# Patient Record
Sex: Female | Born: 1945 | Race: White | Hispanic: No | Smoking: Never smoker
Health system: Southern US, Community
[De-identification: ages and names within clinical notes are randomized; demographics above are authoritative.]

## PROBLEM LIST (undated history)

## (undated) DIAGNOSIS — N3281 Overactive bladder: Secondary | ICD-10-CM

## (undated) DIAGNOSIS — E559 Vitamin D deficiency, unspecified: Secondary | ICD-10-CM

## (undated) DIAGNOSIS — I10 Essential (primary) hypertension: Secondary | ICD-10-CM

## (undated) DIAGNOSIS — E785 Hyperlipidemia, unspecified: Secondary | ICD-10-CM

## (undated) DIAGNOSIS — I2510 Atherosclerotic heart disease of native coronary artery without angina pectoris: Secondary | ICD-10-CM

## (undated) DIAGNOSIS — K219 Gastro-esophageal reflux disease without esophagitis: Secondary | ICD-10-CM

## (undated) HISTORY — DX: Vitamin D deficiency, unspecified: E55.9

## (undated) HISTORY — DX: Essential (primary) hypertension: I10

## (undated) HISTORY — DX: Hyperlipidemia, unspecified: E78.5

## (undated) HISTORY — DX: Atherosclerotic heart disease of native coronary artery without angina pectoris: I25.10

## (undated) HISTORY — DX: Gastro-esophageal reflux disease without esophagitis: K21.9

## (undated) HISTORY — DX: Overactive bladder: N32.81

## (undated) HISTORY — PX: CORONARY ARTERY BYPASS GRAFT: SHX141

---

## 2006-03-27 ENCOUNTER — Inpatient Hospital Stay (HOSPITAL_COMMUNITY): Admission: AD | Admit: 2006-03-27 | Discharge: 2006-04-01 | Payer: Self-pay | Admitting: Cardiology

## 2006-03-27 ENCOUNTER — Ambulatory Visit: Payer: Self-pay | Admitting: Cardiology

## 2006-05-20 ENCOUNTER — Encounter: Admission: RE | Admit: 2006-05-20 | Discharge: 2006-05-20 | Payer: Self-pay | Admitting: Surgery

## 2007-03-05 IMAGING — CR DG CHEST 1V PORT
1 series · 1 of 1 positions shown · non-contrast
Comparison: 03/28/06.

CLINICAL DATA: CABG.
 PORTABLE ONE VIEW CHEST:

[view not recorded]
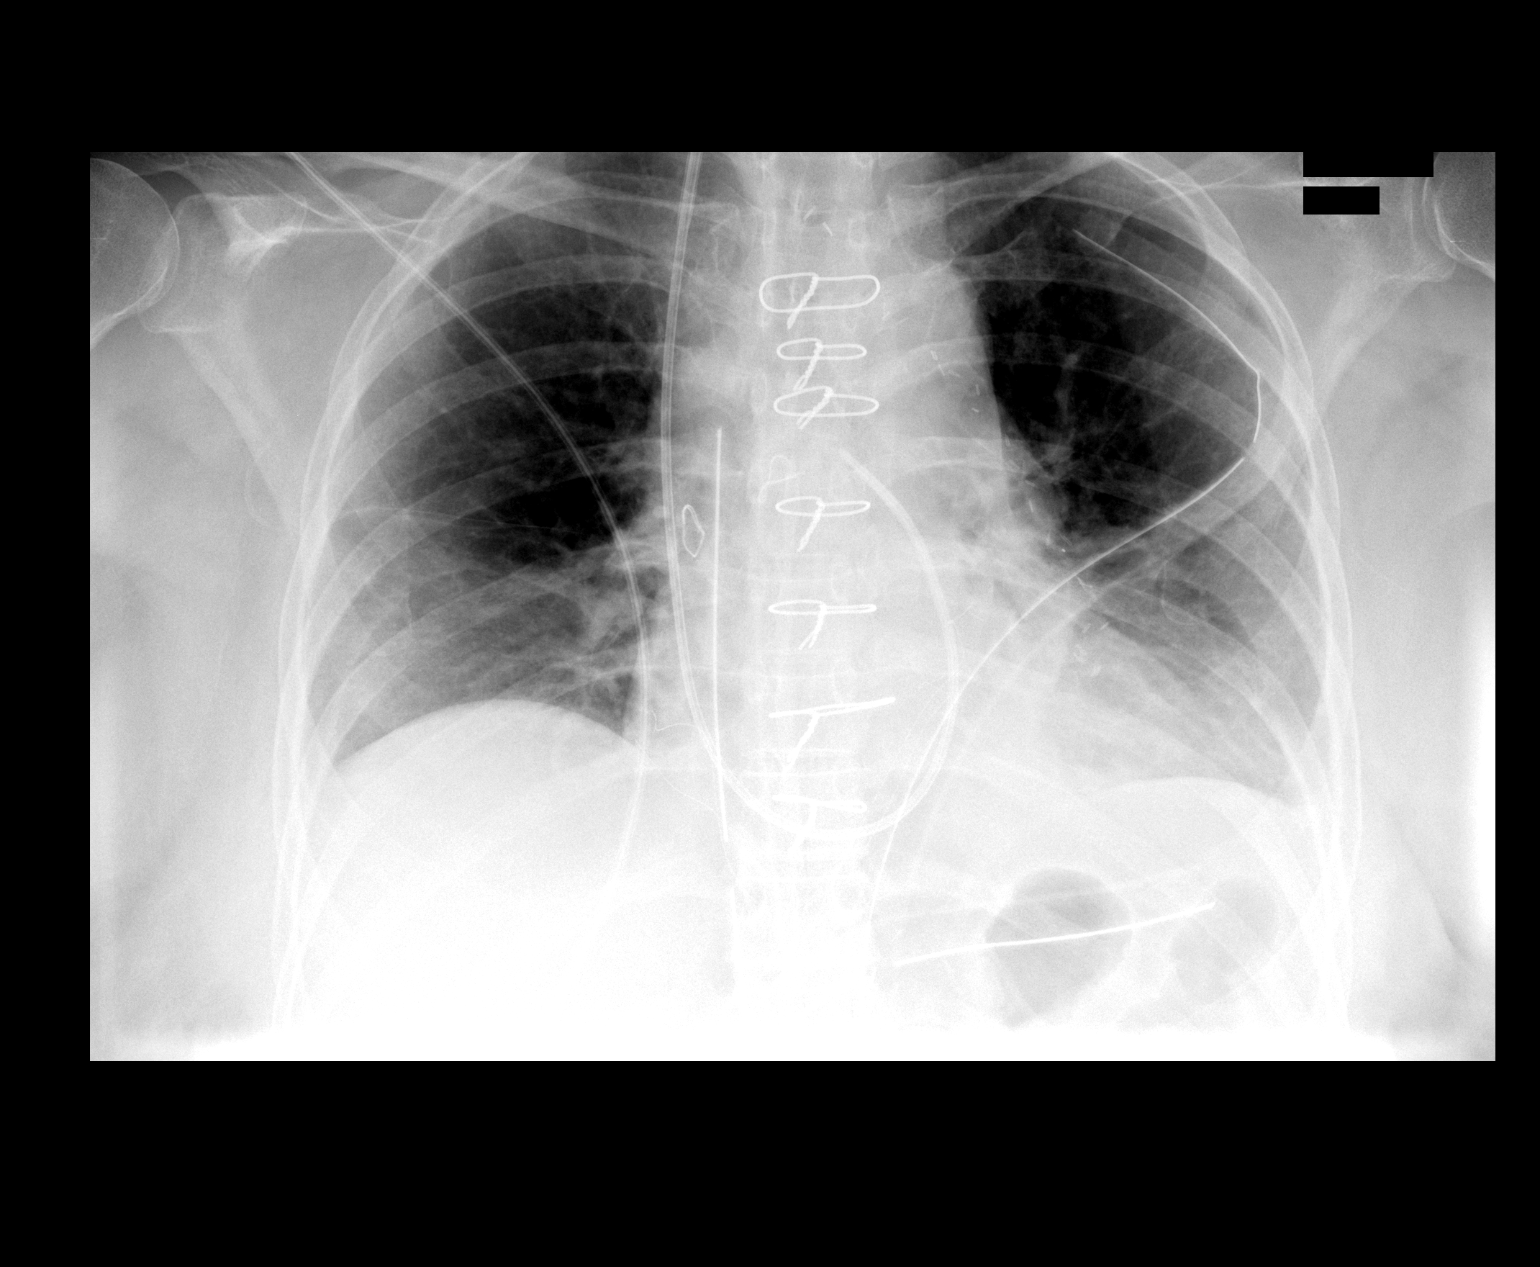

[1 of 1 positions shown; findings below may reference images not displayed]

FINDINGS: Post-op changes from CABG are noted.  There is a mediastinal drain and two left sided chest tubes.  No pneumothorax is identified.  There is atelectasis at both lung bases, left greater than right.
IMPRESSION: 1.  No change compared to prior exam.  
 2.  No pneumothorax. 
 3.  Bibasilar atelectasis persist.

 .

## 2007-03-07 IMAGING — CR DG CHEST 2V
2 series · 2 of 2 positions shown · non-contrast
Comparison: 03/30/06.

CLINICAL DATA: Status-post CABG. 
 CHEST - 2 VIEW ? 03/31/06:

[w chest pa]
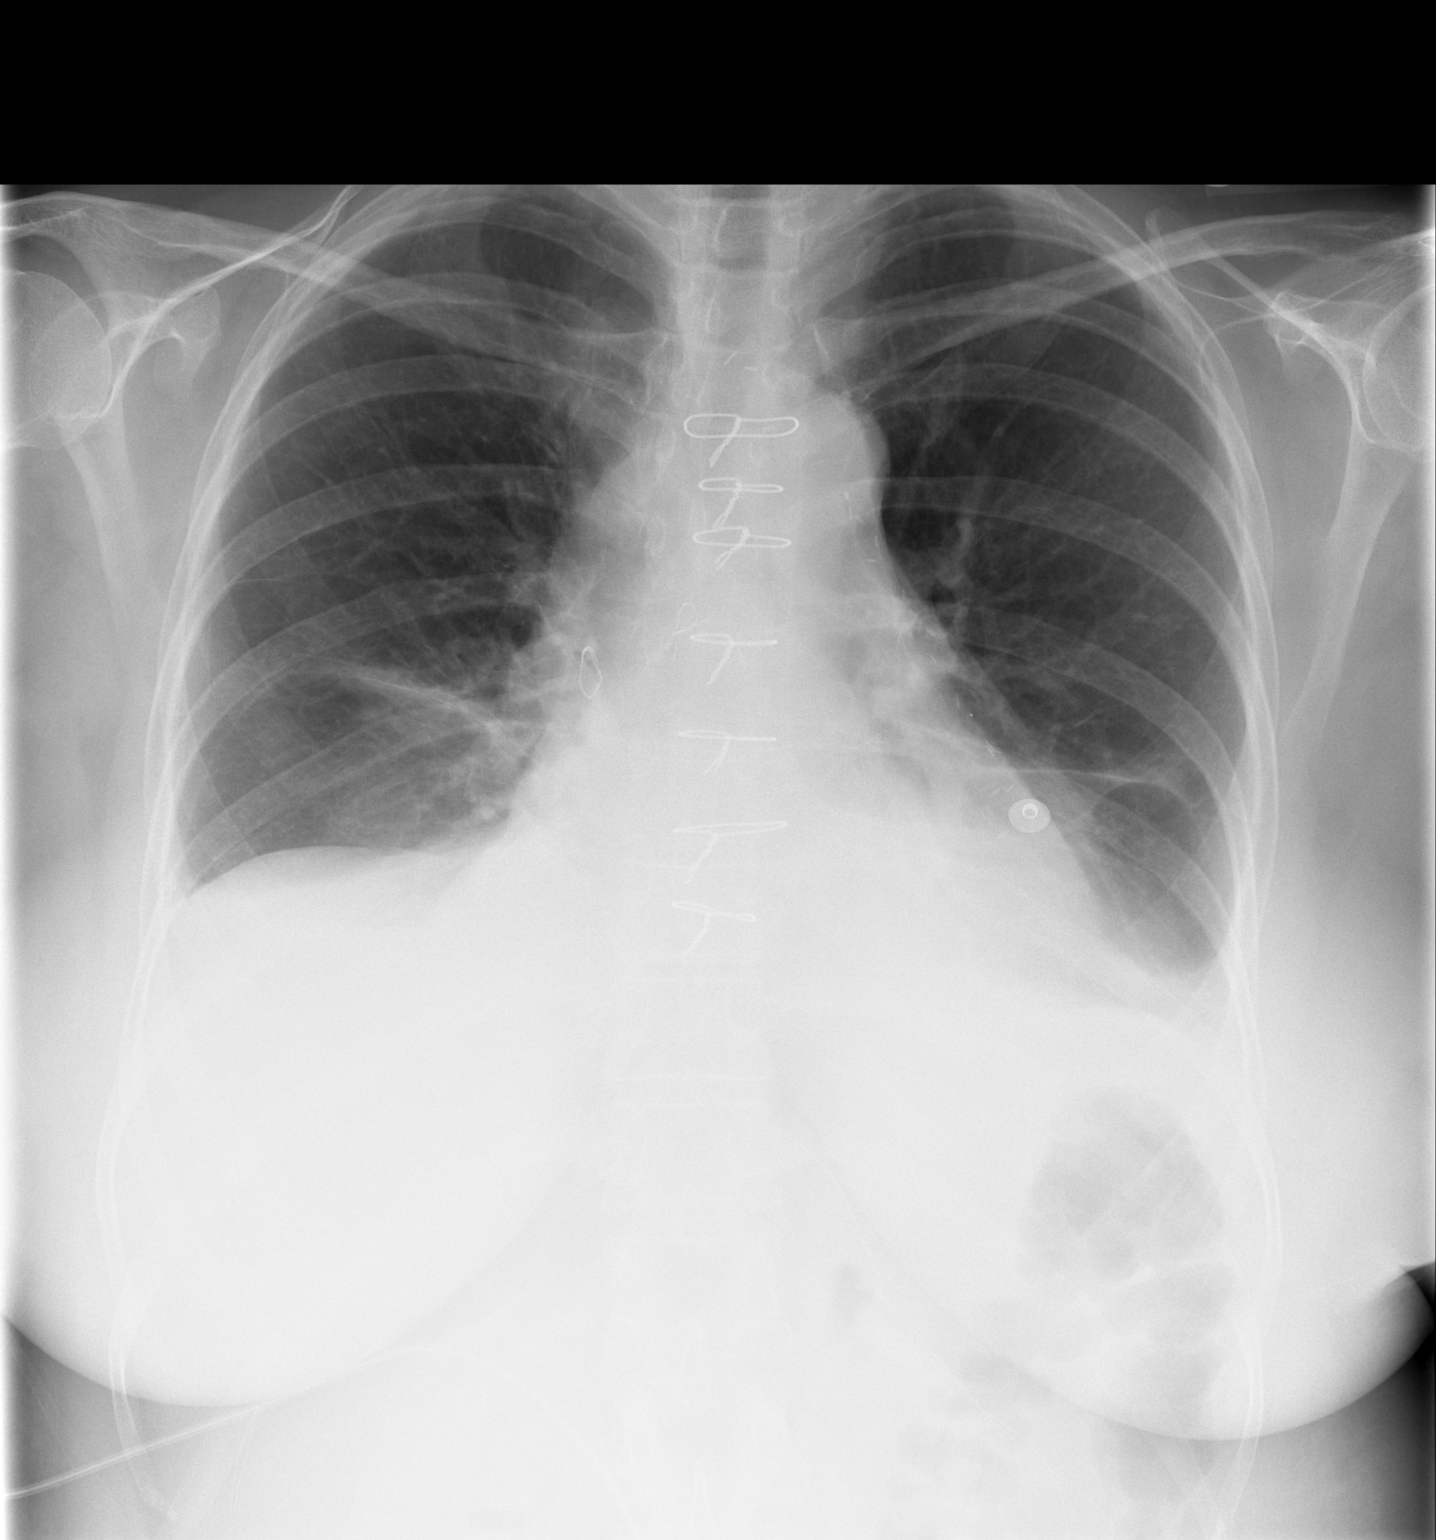

[w chest lat]
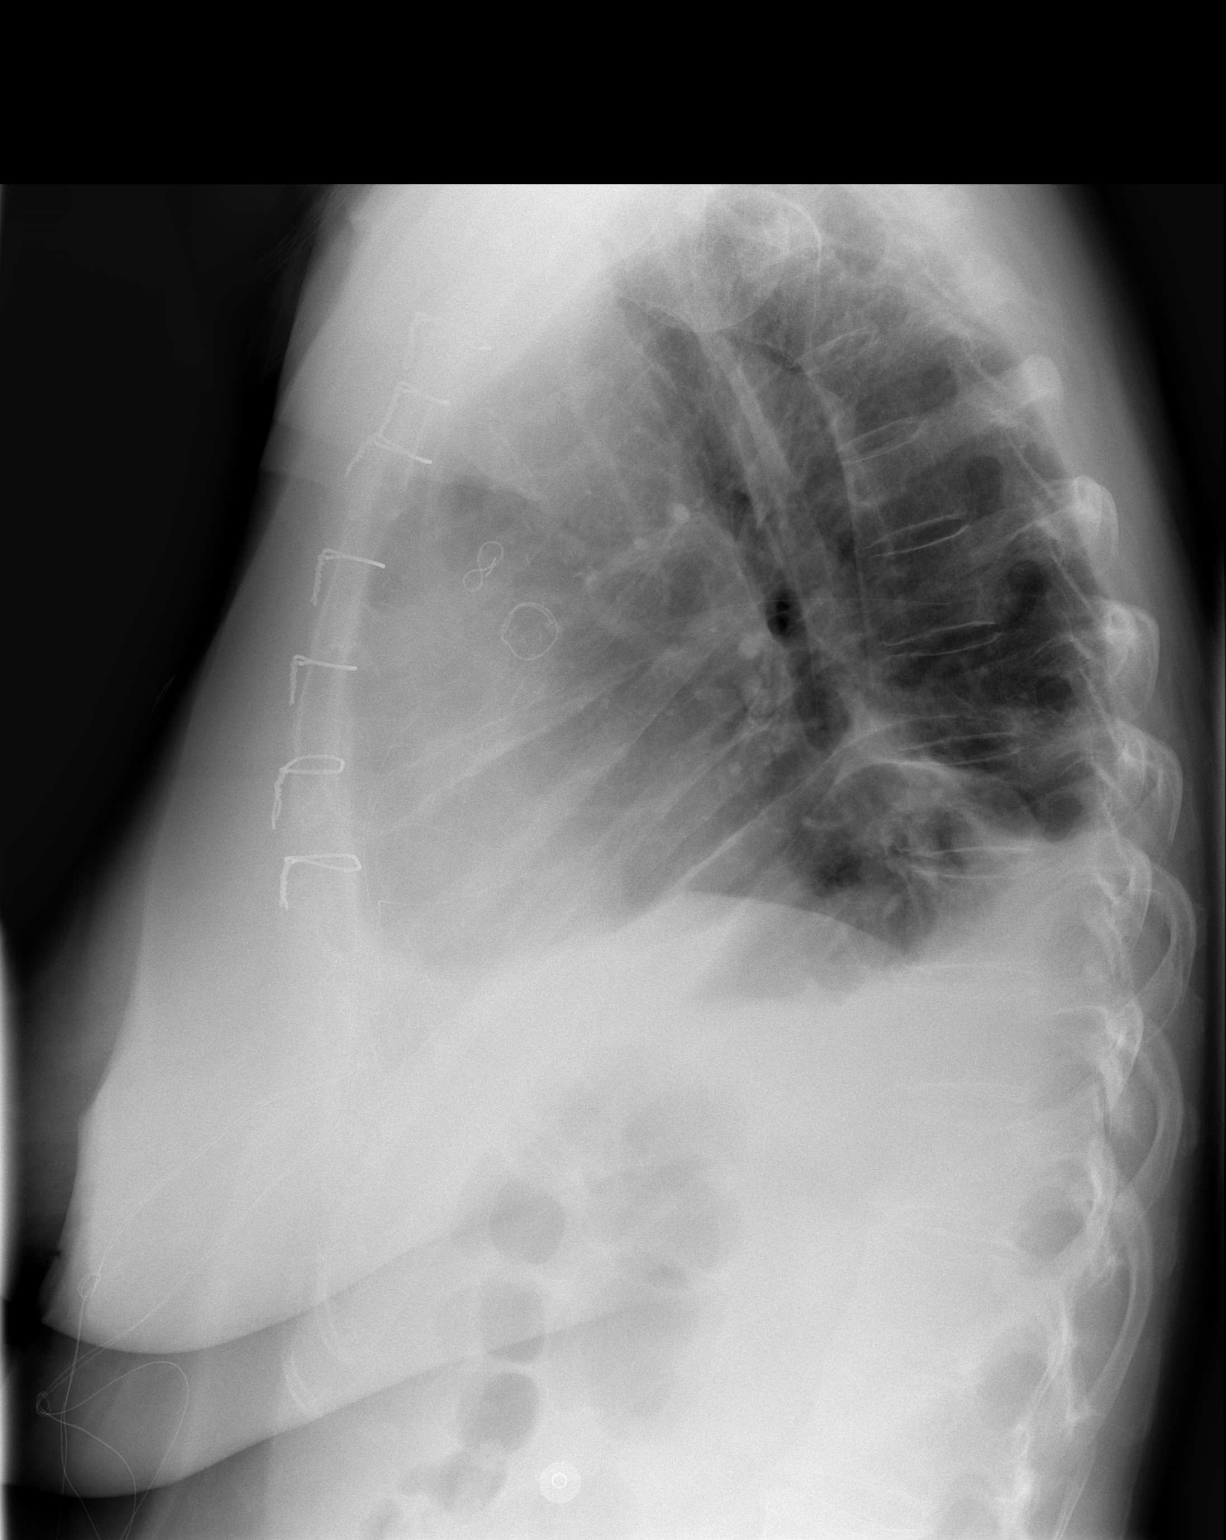

[2 of 2 positions shown; findings below may reference images not displayed]

There are small bilateral pleural effusions with residual bibasilar atelectasis, left greater than right. No pneumothorax is seen.  Stable heart size and mediastinal contours.
IMPRESSION: Small bilateral pleural effusions and associated mild bibasilar atelectasis.

## 2011-12-04 DIAGNOSIS — N318 Other neuromuscular dysfunction of bladder: Secondary | ICD-10-CM | POA: Diagnosis not present

## 2011-12-04 DIAGNOSIS — I10 Essential (primary) hypertension: Secondary | ICD-10-CM | POA: Diagnosis not present

## 2011-12-04 DIAGNOSIS — K219 Gastro-esophageal reflux disease without esophagitis: Secondary | ICD-10-CM | POA: Diagnosis not present

## 2011-12-04 DIAGNOSIS — E782 Mixed hyperlipidemia: Secondary | ICD-10-CM | POA: Diagnosis not present

## 2011-12-04 DIAGNOSIS — Z79899 Other long term (current) drug therapy: Secondary | ICD-10-CM | POA: Diagnosis not present

## 2011-12-19 DIAGNOSIS — I10 Essential (primary) hypertension: Secondary | ICD-10-CM | POA: Diagnosis not present

## 2011-12-19 DIAGNOSIS — I2510 Atherosclerotic heart disease of native coronary artery without angina pectoris: Secondary | ICD-10-CM | POA: Diagnosis not present

## 2011-12-19 DIAGNOSIS — Z79899 Other long term (current) drug therapy: Secondary | ICD-10-CM | POA: Diagnosis not present

## 2011-12-19 DIAGNOSIS — E785 Hyperlipidemia, unspecified: Secondary | ICD-10-CM | POA: Diagnosis not present

## 2012-01-02 DIAGNOSIS — I2510 Atherosclerotic heart disease of native coronary artery without angina pectoris: Secondary | ICD-10-CM | POA: Diagnosis not present

## 2012-01-02 DIAGNOSIS — E785 Hyperlipidemia, unspecified: Secondary | ICD-10-CM | POA: Diagnosis not present

## 2012-01-30 DIAGNOSIS — J029 Acute pharyngitis, unspecified: Secondary | ICD-10-CM | POA: Diagnosis not present

## 2012-03-04 DIAGNOSIS — I2510 Atherosclerotic heart disease of native coronary artery without angina pectoris: Secondary | ICD-10-CM | POA: Diagnosis not present

## 2012-03-04 DIAGNOSIS — Z79899 Other long term (current) drug therapy: Secondary | ICD-10-CM | POA: Diagnosis not present

## 2012-03-04 DIAGNOSIS — N318 Other neuromuscular dysfunction of bladder: Secondary | ICD-10-CM | POA: Diagnosis not present

## 2012-03-04 DIAGNOSIS — E782 Mixed hyperlipidemia: Secondary | ICD-10-CM | POA: Diagnosis not present

## 2012-03-04 DIAGNOSIS — I10 Essential (primary) hypertension: Secondary | ICD-10-CM | POA: Diagnosis not present

## 2012-06-03 DIAGNOSIS — I10 Essential (primary) hypertension: Secondary | ICD-10-CM | POA: Diagnosis not present

## 2012-06-03 DIAGNOSIS — E782 Mixed hyperlipidemia: Secondary | ICD-10-CM | POA: Diagnosis not present

## 2012-06-03 DIAGNOSIS — L03319 Cellulitis of trunk, unspecified: Secondary | ICD-10-CM | POA: Diagnosis not present

## 2012-06-03 DIAGNOSIS — D559 Anemia due to enzyme disorder, unspecified: Secondary | ICD-10-CM | POA: Diagnosis not present

## 2012-06-03 DIAGNOSIS — L02219 Cutaneous abscess of trunk, unspecified: Secondary | ICD-10-CM | POA: Diagnosis not present

## 2012-06-09 DIAGNOSIS — L03319 Cellulitis of trunk, unspecified: Secondary | ICD-10-CM | POA: Diagnosis not present

## 2012-06-09 DIAGNOSIS — Z6830 Body mass index (BMI) 30.0-30.9, adult: Secondary | ICD-10-CM | POA: Diagnosis not present

## 2012-06-09 DIAGNOSIS — L02219 Cutaneous abscess of trunk, unspecified: Secondary | ICD-10-CM | POA: Diagnosis not present

## 2012-06-15 DIAGNOSIS — L738 Other specified follicular disorders: Secondary | ICD-10-CM | POA: Diagnosis not present

## 2012-06-23 DIAGNOSIS — Z1212 Encounter for screening for malignant neoplasm of rectum: Secondary | ICD-10-CM | POA: Diagnosis not present

## 2012-06-23 DIAGNOSIS — Z0000 Encounter for general adult medical examination without abnormal findings: Secondary | ICD-10-CM | POA: Diagnosis not present

## 2012-06-23 DIAGNOSIS — Z79899 Other long term (current) drug therapy: Secondary | ICD-10-CM | POA: Diagnosis not present

## 2012-06-23 DIAGNOSIS — E559 Vitamin D deficiency, unspecified: Secondary | ICD-10-CM | POA: Diagnosis not present

## 2012-06-23 DIAGNOSIS — E782 Mixed hyperlipidemia: Secondary | ICD-10-CM | POA: Diagnosis not present

## 2012-06-23 DIAGNOSIS — I2510 Atherosclerotic heart disease of native coronary artery without angina pectoris: Secondary | ICD-10-CM | POA: Diagnosis not present

## 2012-06-23 DIAGNOSIS — I10 Essential (primary) hypertension: Secondary | ICD-10-CM | POA: Diagnosis not present

## 2012-07-17 DIAGNOSIS — Z1231 Encounter for screening mammogram for malignant neoplasm of breast: Secondary | ICD-10-CM | POA: Diagnosis not present

## 2012-07-27 DIAGNOSIS — N63 Unspecified lump in unspecified breast: Secondary | ICD-10-CM | POA: Diagnosis not present

## 2012-09-02 DIAGNOSIS — Z79899 Other long term (current) drug therapy: Secondary | ICD-10-CM | POA: Diagnosis not present

## 2012-09-02 DIAGNOSIS — I10 Essential (primary) hypertension: Secondary | ICD-10-CM | POA: Diagnosis not present

## 2012-09-02 DIAGNOSIS — I2510 Atherosclerotic heart disease of native coronary artery without angina pectoris: Secondary | ICD-10-CM | POA: Diagnosis not present

## 2012-09-02 DIAGNOSIS — E785 Hyperlipidemia, unspecified: Secondary | ICD-10-CM | POA: Diagnosis not present

## 2012-09-05 DIAGNOSIS — T148XXA Other injury of unspecified body region, initial encounter: Secondary | ICD-10-CM | POA: Diagnosis not present

## 2012-09-29 DIAGNOSIS — I10 Essential (primary) hypertension: Secondary | ICD-10-CM | POA: Diagnosis not present

## 2012-09-29 DIAGNOSIS — M1710 Unilateral primary osteoarthritis, unspecified knee: Secondary | ICD-10-CM | POA: Diagnosis not present

## 2012-09-29 DIAGNOSIS — Z6830 Body mass index (BMI) 30.0-30.9, adult: Secondary | ICD-10-CM | POA: Diagnosis not present

## 2012-09-29 DIAGNOSIS — E782 Mixed hyperlipidemia: Secondary | ICD-10-CM | POA: Diagnosis not present

## 2012-09-29 DIAGNOSIS — IMO0002 Reserved for concepts with insufficient information to code with codable children: Secondary | ICD-10-CM | POA: Diagnosis not present

## 2012-12-30 DIAGNOSIS — Z79899 Other long term (current) drug therapy: Secondary | ICD-10-CM | POA: Diagnosis not present

## 2012-12-30 DIAGNOSIS — E782 Mixed hyperlipidemia: Secondary | ICD-10-CM | POA: Diagnosis not present

## 2012-12-30 DIAGNOSIS — I10 Essential (primary) hypertension: Secondary | ICD-10-CM | POA: Diagnosis not present

## 2012-12-30 DIAGNOSIS — Z6829 Body mass index (BMI) 29.0-29.9, adult: Secondary | ICD-10-CM | POA: Diagnosis not present

## 2012-12-30 DIAGNOSIS — E559 Vitamin D deficiency, unspecified: Secondary | ICD-10-CM | POA: Diagnosis not present

## 2013-01-25 DIAGNOSIS — R928 Other abnormal and inconclusive findings on diagnostic imaging of breast: Secondary | ICD-10-CM | POA: Diagnosis not present

## 2013-02-24 DIAGNOSIS — I2510 Atherosclerotic heart disease of native coronary artery without angina pectoris: Secondary | ICD-10-CM | POA: Diagnosis not present

## 2013-02-24 DIAGNOSIS — E785 Hyperlipidemia, unspecified: Secondary | ICD-10-CM | POA: Diagnosis not present

## 2013-02-24 DIAGNOSIS — I10 Essential (primary) hypertension: Secondary | ICD-10-CM | POA: Diagnosis not present

## 2013-03-29 DIAGNOSIS — Z6830 Body mass index (BMI) 30.0-30.9, adult: Secondary | ICD-10-CM | POA: Diagnosis not present

## 2013-03-29 DIAGNOSIS — K219 Gastro-esophageal reflux disease without esophagitis: Secondary | ICD-10-CM | POA: Diagnosis not present

## 2013-03-29 DIAGNOSIS — I10 Essential (primary) hypertension: Secondary | ICD-10-CM | POA: Diagnosis not present

## 2013-03-29 DIAGNOSIS — R609 Edema, unspecified: Secondary | ICD-10-CM | POA: Diagnosis not present

## 2013-07-01 DIAGNOSIS — I10 Essential (primary) hypertension: Secondary | ICD-10-CM | POA: Diagnosis not present

## 2013-07-01 DIAGNOSIS — E559 Vitamin D deficiency, unspecified: Secondary | ICD-10-CM | POA: Diagnosis not present

## 2013-07-01 DIAGNOSIS — J309 Allergic rhinitis, unspecified: Secondary | ICD-10-CM | POA: Diagnosis not present

## 2013-07-01 DIAGNOSIS — E782 Mixed hyperlipidemia: Secondary | ICD-10-CM | POA: Diagnosis not present

## 2013-07-01 DIAGNOSIS — Z79899 Other long term (current) drug therapy: Secondary | ICD-10-CM | POA: Diagnosis not present

## 2013-08-02 DIAGNOSIS — R928 Other abnormal and inconclusive findings on diagnostic imaging of breast: Secondary | ICD-10-CM | POA: Diagnosis not present

## 2013-09-08 DIAGNOSIS — I259 Chronic ischemic heart disease, unspecified: Secondary | ICD-10-CM | POA: Diagnosis not present

## 2013-09-08 DIAGNOSIS — I10 Essential (primary) hypertension: Secondary | ICD-10-CM | POA: Diagnosis not present

## 2013-09-08 DIAGNOSIS — E785 Hyperlipidemia, unspecified: Secondary | ICD-10-CM | POA: Diagnosis not present

## 2013-10-01 DIAGNOSIS — E785 Hyperlipidemia, unspecified: Secondary | ICD-10-CM | POA: Diagnosis not present

## 2013-10-01 DIAGNOSIS — E559 Vitamin D deficiency, unspecified: Secondary | ICD-10-CM | POA: Diagnosis not present

## 2013-10-01 DIAGNOSIS — Z0000 Encounter for general adult medical examination without abnormal findings: Secondary | ICD-10-CM | POA: Diagnosis not present

## 2013-10-01 DIAGNOSIS — Z1212 Encounter for screening for malignant neoplasm of rectum: Secondary | ICD-10-CM | POA: Diagnosis not present

## 2013-10-01 DIAGNOSIS — I2510 Atherosclerotic heart disease of native coronary artery without angina pectoris: Secondary | ICD-10-CM | POA: Diagnosis not present

## 2013-11-08 DIAGNOSIS — Z13820 Encounter for screening for osteoporosis: Secondary | ICD-10-CM | POA: Diagnosis not present

## 2014-01-05 DIAGNOSIS — J309 Allergic rhinitis, unspecified: Secondary | ICD-10-CM | POA: Diagnosis not present

## 2014-01-05 DIAGNOSIS — Z79899 Other long term (current) drug therapy: Secondary | ICD-10-CM | POA: Diagnosis not present

## 2014-01-05 DIAGNOSIS — I10 Essential (primary) hypertension: Secondary | ICD-10-CM | POA: Diagnosis not present

## 2014-02-01 DIAGNOSIS — J111 Influenza due to unidentified influenza virus with other respiratory manifestations: Secondary | ICD-10-CM | POA: Diagnosis not present

## 2014-02-08 DIAGNOSIS — Z79899 Other long term (current) drug therapy: Secondary | ICD-10-CM | POA: Diagnosis not present

## 2014-02-08 DIAGNOSIS — Z6828 Body mass index (BMI) 28.0-28.9, adult: Secondary | ICD-10-CM | POA: Diagnosis not present

## 2014-02-08 DIAGNOSIS — R197 Diarrhea, unspecified: Secondary | ICD-10-CM | POA: Diagnosis not present

## 2014-02-08 DIAGNOSIS — K625 Hemorrhage of anus and rectum: Secondary | ICD-10-CM | POA: Diagnosis not present

## 2014-03-10 DIAGNOSIS — E785 Hyperlipidemia, unspecified: Secondary | ICD-10-CM | POA: Diagnosis not present

## 2014-03-10 DIAGNOSIS — I2510 Atherosclerotic heart disease of native coronary artery without angina pectoris: Secondary | ICD-10-CM | POA: Diagnosis not present

## 2014-03-10 DIAGNOSIS — I10 Essential (primary) hypertension: Secondary | ICD-10-CM | POA: Diagnosis not present

## 2014-03-10 DIAGNOSIS — Z79899 Other long term (current) drug therapy: Secondary | ICD-10-CM | POA: Diagnosis not present

## 2014-04-05 DIAGNOSIS — J45909 Unspecified asthma, uncomplicated: Secondary | ICD-10-CM | POA: Diagnosis not present

## 2014-04-05 DIAGNOSIS — E782 Mixed hyperlipidemia: Secondary | ICD-10-CM | POA: Diagnosis not present

## 2014-04-05 DIAGNOSIS — J309 Allergic rhinitis, unspecified: Secondary | ICD-10-CM | POA: Diagnosis not present

## 2014-04-05 DIAGNOSIS — H6990 Unspecified Eustachian tube disorder, unspecified ear: Secondary | ICD-10-CM | POA: Diagnosis not present

## 2014-04-26 DIAGNOSIS — H269 Unspecified cataract: Secondary | ICD-10-CM | POA: Diagnosis not present

## 2014-05-04 DIAGNOSIS — H43819 Vitreous degeneration, unspecified eye: Secondary | ICD-10-CM | POA: Diagnosis not present

## 2014-06-06 DIAGNOSIS — H43819 Vitreous degeneration, unspecified eye: Secondary | ICD-10-CM | POA: Diagnosis not present

## 2014-06-06 DIAGNOSIS — H269 Unspecified cataract: Secondary | ICD-10-CM | POA: Diagnosis not present

## 2014-07-04 DIAGNOSIS — H43819 Vitreous degeneration, unspecified eye: Secondary | ICD-10-CM | POA: Diagnosis not present

## 2014-07-04 DIAGNOSIS — H35439 Paving stone degeneration of retina, unspecified eye: Secondary | ICD-10-CM | POA: Diagnosis not present

## 2014-07-14 DIAGNOSIS — F39 Unspecified mood [affective] disorder: Secondary | ICD-10-CM | POA: Diagnosis not present

## 2014-07-14 DIAGNOSIS — E782 Mixed hyperlipidemia: Secondary | ICD-10-CM | POA: Diagnosis not present

## 2014-07-14 DIAGNOSIS — H68009 Unspecified Eustachian salpingitis, unspecified ear: Secondary | ICD-10-CM | POA: Diagnosis not present

## 2014-07-14 DIAGNOSIS — Z79899 Other long term (current) drug therapy: Secondary | ICD-10-CM | POA: Diagnosis not present

## 2014-07-14 DIAGNOSIS — J309 Allergic rhinitis, unspecified: Secondary | ICD-10-CM | POA: Diagnosis not present

## 2014-07-14 DIAGNOSIS — E559 Vitamin D deficiency, unspecified: Secondary | ICD-10-CM | POA: Diagnosis not present

## 2014-08-25 DIAGNOSIS — Z23 Encounter for immunization: Secondary | ICD-10-CM | POA: Diagnosis not present

## 2014-08-25 DIAGNOSIS — J45909 Unspecified asthma, uncomplicated: Secondary | ICD-10-CM | POA: Diagnosis not present

## 2014-08-25 DIAGNOSIS — E782 Mixed hyperlipidemia: Secondary | ICD-10-CM | POA: Diagnosis not present

## 2014-08-25 DIAGNOSIS — Z79899 Other long term (current) drug therapy: Secondary | ICD-10-CM | POA: Diagnosis not present

## 2014-08-25 DIAGNOSIS — Z0000 Encounter for general adult medical examination without abnormal findings: Secondary | ICD-10-CM | POA: Diagnosis not present

## 2014-08-25 DIAGNOSIS — E559 Vitamin D deficiency, unspecified: Secondary | ICD-10-CM | POA: Diagnosis not present

## 2014-08-25 DIAGNOSIS — Z1231 Encounter for screening mammogram for malignant neoplasm of breast: Secondary | ICD-10-CM | POA: Diagnosis not present

## 2014-08-25 DIAGNOSIS — I10 Essential (primary) hypertension: Secondary | ICD-10-CM | POA: Diagnosis not present

## 2014-09-21 DIAGNOSIS — N6001 Solitary cyst of right breast: Secondary | ICD-10-CM | POA: Diagnosis not present

## 2014-09-21 DIAGNOSIS — N6009 Solitary cyst of unspecified breast: Secondary | ICD-10-CM | POA: Diagnosis not present

## 2014-09-21 DIAGNOSIS — R928 Other abnormal and inconclusive findings on diagnostic imaging of breast: Secondary | ICD-10-CM | POA: Diagnosis not present

## 2014-11-24 DIAGNOSIS — I10 Essential (primary) hypertension: Secondary | ICD-10-CM | POA: Diagnosis not present

## 2014-11-24 DIAGNOSIS — K219 Gastro-esophageal reflux disease without esophagitis: Secondary | ICD-10-CM | POA: Diagnosis not present

## 2014-11-24 DIAGNOSIS — J309 Allergic rhinitis, unspecified: Secondary | ICD-10-CM | POA: Diagnosis not present

## 2014-11-24 DIAGNOSIS — E559 Vitamin D deficiency, unspecified: Secondary | ICD-10-CM | POA: Diagnosis not present

## 2014-11-24 DIAGNOSIS — R0689 Other abnormalities of breathing: Secondary | ICD-10-CM | POA: Diagnosis not present

## 2014-11-24 DIAGNOSIS — N3281 Overactive bladder: Secondary | ICD-10-CM | POA: Diagnosis not present

## 2014-11-24 DIAGNOSIS — Z23 Encounter for immunization: Secondary | ICD-10-CM | POA: Diagnosis not present

## 2014-11-24 DIAGNOSIS — Z6830 Body mass index (BMI) 30.0-30.9, adult: Secondary | ICD-10-CM | POA: Diagnosis not present

## 2014-11-24 DIAGNOSIS — E782 Mixed hyperlipidemia: Secondary | ICD-10-CM | POA: Diagnosis not present

## 2014-12-07 DIAGNOSIS — K219 Gastro-esophageal reflux disease without esophagitis: Secondary | ICD-10-CM | POA: Diagnosis not present

## 2014-12-07 DIAGNOSIS — R1310 Dysphagia, unspecified: Secondary | ICD-10-CM | POA: Diagnosis not present

## 2014-12-14 DIAGNOSIS — R1310 Dysphagia, unspecified: Secondary | ICD-10-CM | POA: Diagnosis not present

## 2014-12-16 DIAGNOSIS — R1310 Dysphagia, unspecified: Secondary | ICD-10-CM | POA: Diagnosis not present

## 2014-12-16 DIAGNOSIS — K222 Esophageal obstruction: Secondary | ICD-10-CM | POA: Diagnosis not present

## 2014-12-16 DIAGNOSIS — K449 Diaphragmatic hernia without obstruction or gangrene: Secondary | ICD-10-CM | POA: Diagnosis not present

## 2014-12-16 DIAGNOSIS — I519 Heart disease, unspecified: Secondary | ICD-10-CM | POA: Diagnosis not present

## 2014-12-16 DIAGNOSIS — K3189 Other diseases of stomach and duodenum: Secondary | ICD-10-CM | POA: Diagnosis not present

## 2014-12-16 DIAGNOSIS — I10 Essential (primary) hypertension: Secondary | ICD-10-CM | POA: Diagnosis not present

## 2014-12-16 DIAGNOSIS — K317 Polyp of stomach and duodenum: Secondary | ICD-10-CM | POA: Diagnosis not present

## 2015-01-18 DIAGNOSIS — R1310 Dysphagia, unspecified: Secondary | ICD-10-CM | POA: Diagnosis not present

## 2015-01-18 DIAGNOSIS — K219 Gastro-esophageal reflux disease without esophagitis: Secondary | ICD-10-CM | POA: Diagnosis not present

## 2015-03-01 DIAGNOSIS — N3281 Overactive bladder: Secondary | ICD-10-CM | POA: Diagnosis not present

## 2015-03-01 DIAGNOSIS — L0233 Carbuncle of buttock: Secondary | ICD-10-CM | POA: Diagnosis not present

## 2015-03-01 DIAGNOSIS — Z6830 Body mass index (BMI) 30.0-30.9, adult: Secondary | ICD-10-CM | POA: Diagnosis not present

## 2015-03-01 DIAGNOSIS — J45909 Unspecified asthma, uncomplicated: Secondary | ICD-10-CM | POA: Diagnosis not present

## 2015-03-01 DIAGNOSIS — J309 Allergic rhinitis, unspecified: Secondary | ICD-10-CM | POA: Diagnosis not present

## 2015-03-06 DIAGNOSIS — E782 Mixed hyperlipidemia: Secondary | ICD-10-CM | POA: Diagnosis not present

## 2015-03-06 DIAGNOSIS — I10 Essential (primary) hypertension: Secondary | ICD-10-CM | POA: Diagnosis not present

## 2015-03-06 DIAGNOSIS — Z79899 Other long term (current) drug therapy: Secondary | ICD-10-CM | POA: Diagnosis not present

## 2015-03-06 DIAGNOSIS — K219 Gastro-esophageal reflux disease without esophagitis: Secondary | ICD-10-CM | POA: Diagnosis not present

## 2015-03-06 DIAGNOSIS — J309 Allergic rhinitis, unspecified: Secondary | ICD-10-CM | POA: Diagnosis not present

## 2015-03-06 DIAGNOSIS — E559 Vitamin D deficiency, unspecified: Secondary | ICD-10-CM | POA: Diagnosis not present

## 2015-03-06 DIAGNOSIS — Z6830 Body mass index (BMI) 30.0-30.9, adult: Secondary | ICD-10-CM | POA: Diagnosis not present

## 2015-08-28 DIAGNOSIS — Z23 Encounter for immunization: Secondary | ICD-10-CM | POA: Diagnosis not present

## 2015-09-11 DIAGNOSIS — K219 Gastro-esophageal reflux disease without esophagitis: Secondary | ICD-10-CM | POA: Diagnosis not present

## 2015-09-11 DIAGNOSIS — E782 Mixed hyperlipidemia: Secondary | ICD-10-CM | POA: Diagnosis not present

## 2015-09-11 DIAGNOSIS — M8589 Other specified disorders of bone density and structure, multiple sites: Secondary | ICD-10-CM | POA: Diagnosis not present

## 2015-09-11 DIAGNOSIS — E559 Vitamin D deficiency, unspecified: Secondary | ICD-10-CM | POA: Diagnosis not present

## 2015-09-11 DIAGNOSIS — Z9181 History of falling: Secondary | ICD-10-CM | POA: Diagnosis not present

## 2015-09-11 DIAGNOSIS — Z0000 Encounter for general adult medical examination without abnormal findings: Secondary | ICD-10-CM | POA: Diagnosis not present

## 2015-09-11 DIAGNOSIS — Z6830 Body mass index (BMI) 30.0-30.9, adult: Secondary | ICD-10-CM | POA: Diagnosis not present

## 2015-09-11 DIAGNOSIS — Z1231 Encounter for screening mammogram for malignant neoplasm of breast: Secondary | ICD-10-CM | POA: Diagnosis not present

## 2015-09-11 DIAGNOSIS — Z23 Encounter for immunization: Secondary | ICD-10-CM | POA: Diagnosis not present

## 2015-09-11 DIAGNOSIS — Z79899 Other long term (current) drug therapy: Secondary | ICD-10-CM | POA: Diagnosis not present

## 2015-09-11 DIAGNOSIS — Z1212 Encounter for screening for malignant neoplasm of rectum: Secondary | ICD-10-CM | POA: Diagnosis not present

## 2015-09-11 DIAGNOSIS — I10 Essential (primary) hypertension: Secondary | ICD-10-CM | POA: Diagnosis not present

## 2015-09-14 DIAGNOSIS — E782 Mixed hyperlipidemia: Secondary | ICD-10-CM | POA: Diagnosis not present

## 2015-09-14 DIAGNOSIS — I10 Essential (primary) hypertension: Secondary | ICD-10-CM | POA: Diagnosis not present

## 2015-09-14 DIAGNOSIS — I2510 Atherosclerotic heart disease of native coronary artery without angina pectoris: Secondary | ICD-10-CM | POA: Diagnosis not present

## 2015-09-14 DIAGNOSIS — Z6830 Body mass index (BMI) 30.0-30.9, adult: Secondary | ICD-10-CM | POA: Diagnosis not present

## 2015-11-10 DIAGNOSIS — Z13820 Encounter for screening for osteoporosis: Secondary | ICD-10-CM | POA: Diagnosis not present

## 2015-11-10 DIAGNOSIS — M8589 Other specified disorders of bone density and structure, multiple sites: Secondary | ICD-10-CM | POA: Diagnosis not present

## 2015-11-10 DIAGNOSIS — Z1231 Encounter for screening mammogram for malignant neoplasm of breast: Secondary | ICD-10-CM | POA: Diagnosis not present

## 2015-11-22 DIAGNOSIS — J019 Acute sinusitis, unspecified: Secondary | ICD-10-CM | POA: Diagnosis not present

## 2015-11-22 DIAGNOSIS — Z6830 Body mass index (BMI) 30.0-30.9, adult: Secondary | ICD-10-CM | POA: Diagnosis not present

## 2015-11-22 DIAGNOSIS — J209 Acute bronchitis, unspecified: Secondary | ICD-10-CM | POA: Diagnosis not present

## 2016-01-09 DIAGNOSIS — J309 Allergic rhinitis, unspecified: Secondary | ICD-10-CM | POA: Diagnosis not present

## 2016-01-09 DIAGNOSIS — Z6829 Body mass index (BMI) 29.0-29.9, adult: Secondary | ICD-10-CM | POA: Diagnosis not present

## 2016-01-09 DIAGNOSIS — E559 Vitamin D deficiency, unspecified: Secondary | ICD-10-CM | POA: Diagnosis not present

## 2016-01-09 DIAGNOSIS — N3281 Overactive bladder: Secondary | ICD-10-CM | POA: Diagnosis not present

## 2016-01-09 DIAGNOSIS — F39 Unspecified mood [affective] disorder: Secondary | ICD-10-CM | POA: Diagnosis not present

## 2016-01-09 DIAGNOSIS — E782 Mixed hyperlipidemia: Secondary | ICD-10-CM | POA: Diagnosis not present

## 2016-01-09 DIAGNOSIS — K219 Gastro-esophageal reflux disease without esophagitis: Secondary | ICD-10-CM | POA: Diagnosis not present

## 2016-01-09 DIAGNOSIS — E663 Overweight: Secondary | ICD-10-CM | POA: Diagnosis not present

## 2016-01-09 DIAGNOSIS — I2510 Atherosclerotic heart disease of native coronary artery without angina pectoris: Secondary | ICD-10-CM | POA: Diagnosis not present

## 2016-01-09 DIAGNOSIS — I10 Essential (primary) hypertension: Secondary | ICD-10-CM | POA: Diagnosis not present

## 2016-01-09 DIAGNOSIS — Z1389 Encounter for screening for other disorder: Secondary | ICD-10-CM | POA: Diagnosis not present

## 2016-05-23 DIAGNOSIS — B372 Candidiasis of skin and nail: Secondary | ICD-10-CM | POA: Diagnosis not present

## 2016-05-23 DIAGNOSIS — Z6830 Body mass index (BMI) 30.0-30.9, adult: Secondary | ICD-10-CM | POA: Diagnosis not present

## 2016-09-17 DIAGNOSIS — I10 Essential (primary) hypertension: Secondary | ICD-10-CM | POA: Diagnosis not present

## 2016-09-17 DIAGNOSIS — Z0000 Encounter for general adult medical examination without abnormal findings: Secondary | ICD-10-CM | POA: Diagnosis not present

## 2016-09-17 DIAGNOSIS — Z6829 Body mass index (BMI) 29.0-29.9, adult: Secondary | ICD-10-CM | POA: Diagnosis not present

## 2016-09-17 DIAGNOSIS — Z9181 History of falling: Secondary | ICD-10-CM | POA: Diagnosis not present

## 2016-09-17 DIAGNOSIS — E782 Mixed hyperlipidemia: Secondary | ICD-10-CM | POA: Diagnosis not present

## 2016-09-17 DIAGNOSIS — E559 Vitamin D deficiency, unspecified: Secondary | ICD-10-CM | POA: Diagnosis not present

## 2016-09-17 DIAGNOSIS — J309 Allergic rhinitis, unspecified: Secondary | ICD-10-CM | POA: Diagnosis not present

## 2016-09-17 DIAGNOSIS — Z23 Encounter for immunization: Secondary | ICD-10-CM | POA: Diagnosis not present

## 2016-09-17 DIAGNOSIS — N3281 Overactive bladder: Secondary | ICD-10-CM | POA: Diagnosis not present

## 2016-09-17 DIAGNOSIS — Z79899 Other long term (current) drug therapy: Secondary | ICD-10-CM | POA: Diagnosis not present

## 2016-09-17 DIAGNOSIS — Z1231 Encounter for screening mammogram for malignant neoplasm of breast: Secondary | ICD-10-CM | POA: Diagnosis not present

## 2016-11-13 DIAGNOSIS — Z1231 Encounter for screening mammogram for malignant neoplasm of breast: Secondary | ICD-10-CM | POA: Diagnosis not present

## 2016-12-05 DIAGNOSIS — R928 Other abnormal and inconclusive findings on diagnostic imaging of breast: Secondary | ICD-10-CM | POA: Diagnosis not present

## 2016-12-26 DIAGNOSIS — L820 Inflamed seborrheic keratosis: Secondary | ICD-10-CM | POA: Diagnosis not present

## 2016-12-26 DIAGNOSIS — L814 Other melanin hyperpigmentation: Secondary | ICD-10-CM | POA: Diagnosis not present

## 2016-12-26 DIAGNOSIS — C44629 Squamous cell carcinoma of skin of left upper limb, including shoulder: Secondary | ICD-10-CM | POA: Diagnosis not present

## 2017-01-15 DIAGNOSIS — N3281 Overactive bladder: Secondary | ICD-10-CM | POA: Diagnosis not present

## 2017-01-15 DIAGNOSIS — Z6829 Body mass index (BMI) 29.0-29.9, adult: Secondary | ICD-10-CM | POA: Diagnosis not present

## 2017-01-15 DIAGNOSIS — E559 Vitamin D deficiency, unspecified: Secondary | ICD-10-CM | POA: Diagnosis not present

## 2017-01-15 DIAGNOSIS — E782 Mixed hyperlipidemia: Secondary | ICD-10-CM | POA: Diagnosis not present

## 2017-01-15 DIAGNOSIS — J309 Allergic rhinitis, unspecified: Secondary | ICD-10-CM | POA: Diagnosis not present

## 2017-01-15 DIAGNOSIS — E663 Overweight: Secondary | ICD-10-CM | POA: Diagnosis not present

## 2017-01-15 DIAGNOSIS — K219 Gastro-esophageal reflux disease without esophagitis: Secondary | ICD-10-CM | POA: Diagnosis not present

## 2017-01-15 DIAGNOSIS — Z79899 Other long term (current) drug therapy: Secondary | ICD-10-CM | POA: Diagnosis not present

## 2017-01-15 DIAGNOSIS — I10 Essential (primary) hypertension: Secondary | ICD-10-CM | POA: Diagnosis not present

## 2017-07-03 DIAGNOSIS — J309 Allergic rhinitis, unspecified: Secondary | ICD-10-CM | POA: Diagnosis not present

## 2017-07-03 DIAGNOSIS — F419 Anxiety disorder, unspecified: Secondary | ICD-10-CM | POA: Diagnosis not present

## 2017-07-03 DIAGNOSIS — Z79899 Other long term (current) drug therapy: Secondary | ICD-10-CM | POA: Diagnosis not present

## 2017-07-03 DIAGNOSIS — Z6829 Body mass index (BMI) 29.0-29.9, adult: Secondary | ICD-10-CM | POA: Diagnosis not present

## 2017-07-03 DIAGNOSIS — Z9181 History of falling: Secondary | ICD-10-CM | POA: Diagnosis not present

## 2017-07-03 DIAGNOSIS — K219 Gastro-esophageal reflux disease without esophagitis: Secondary | ICD-10-CM | POA: Diagnosis not present

## 2017-07-03 DIAGNOSIS — I10 Essential (primary) hypertension: Secondary | ICD-10-CM | POA: Diagnosis not present

## 2017-07-03 DIAGNOSIS — E559 Vitamin D deficiency, unspecified: Secondary | ICD-10-CM | POA: Diagnosis not present

## 2017-07-03 DIAGNOSIS — N3281 Overactive bladder: Secondary | ICD-10-CM | POA: Diagnosis not present

## 2017-07-03 DIAGNOSIS — I2510 Atherosclerotic heart disease of native coronary artery without angina pectoris: Secondary | ICD-10-CM | POA: Diagnosis not present

## 2017-07-03 DIAGNOSIS — Z1389 Encounter for screening for other disorder: Secondary | ICD-10-CM | POA: Diagnosis not present

## 2017-07-03 DIAGNOSIS — E782 Mixed hyperlipidemia: Secondary | ICD-10-CM | POA: Diagnosis not present

## 2017-07-10 ENCOUNTER — Encounter: Payer: Self-pay | Admitting: Cardiology

## 2017-08-08 DIAGNOSIS — Z23 Encounter for immunization: Secondary | ICD-10-CM | POA: Diagnosis not present

## 2017-08-26 ENCOUNTER — Ambulatory Visit: Payer: Self-pay | Admitting: Cardiology

## 2017-08-26 ENCOUNTER — Ambulatory Visit (INDEPENDENT_AMBULATORY_CARE_PROVIDER_SITE_OTHER): Payer: Medicare Other | Admitting: Cardiology

## 2017-08-26 ENCOUNTER — Encounter: Payer: Self-pay | Admitting: Cardiology

## 2017-08-26 ENCOUNTER — Other Ambulatory Visit: Payer: Self-pay

## 2017-08-26 VITALS — BP 124/70 | HR 63 | Ht 68.0 in | Wt 191.0 lb

## 2017-08-26 DIAGNOSIS — E782 Mixed hyperlipidemia: Secondary | ICD-10-CM | POA: Diagnosis not present

## 2017-08-26 DIAGNOSIS — I2510 Atherosclerotic heart disease of native coronary artery without angina pectoris: Secondary | ICD-10-CM

## 2017-08-26 DIAGNOSIS — I10 Essential (primary) hypertension: Secondary | ICD-10-CM

## 2017-08-26 DIAGNOSIS — E785 Hyperlipidemia, unspecified: Secondary | ICD-10-CM | POA: Insufficient documentation

## 2017-08-26 HISTORY — DX: Atherosclerotic heart disease of native coronary artery without angina pectoris: I25.10

## 2017-08-26 NOTE — Patient Instructions (Signed)
Medication Instructions:  Your physician recommends that you continue on your current medications as directed. Please refer to the Current Medication list given to you today.   Labwork: None  Testing/Procedures: None  Follow-Up: 6 months  Any Other Special Instructions Will Be Listed Below (If Applicable).     If you need a refill on your cardiac medications before your next appointment, please call your pharmacy.   

## 2017-08-26 NOTE — Progress Notes (Signed)
Cardiology Office Note:    Date:  08/26/2017   ID:  Paige Walters, DOB 03-31-1946, MRN [id number]  PCP:  Nicholos Johns, MD  Cardiologist:  Jenean Lindau, MD   Referring MD: Nicholos Johns, MD    ASSESSMENT:    1. Coronary artery disease involving native coronary artery of native heart without angina pectoris   2. Essential hypertension   3. Mixed hyperlipidemia    PLAN:    In order of problems listed above:  1. Secondary prevention stressed to the patient. Importance of compliance with diet and medications stressed and she verbalized understanding. 2. Her blood pressure stable and diet was discussed with dyslipidemia and essential hypertension and she verbalized understanding. Her lipids are followed by her primary care physician. 3. Importance of regular exercise stressed and this was outlined to her and she agreed to follow these regularly from now on. 4. Patient will be seen in follow-up appointment in 6 months or earlier if the patient has any concerns.    Medication Adjustments/Labs and Tests Ordered: Current medicines are reviewed at length with the patient today.  Concerns regarding medicines are outlined above.  No orders of the defined types were placed in this encounter.  No orders of the defined types were placed in this encounter.    History of Present Illness:    Paige Walters is a 71 y.o. female who is being seen today for the evaluation of coronary artery disease at the request of Nicholos Johns, MD. Patient is a pleasant 71 year old female. She has past medical history of coronary artery disease, essential hypertension and dyslipidemia. She has been taken care of by me and at the previous practice and wants her care to be transferred. She denies any chest pain orthopnea or PND. At the time of my evaluation she is alert awake oriented and in no distress. She leads a sedentary lifestyle and does not exercise on a regular basis.  Past Medical History:  Diagnosis Date  .  CAD (coronary artery disease)   . GERD (gastroesophageal reflux disease)   . Hyperlipidemia   . Hypertension   . Overactive bladder   . Vitamin D deficiency     Past Surgical History:  Procedure Laterality Date  . CORONARY ARTERY BYPASS GRAFT      Current Medications: Current Meds  Medication Sig  . Coenzyme Q-10 200 MG CAPS Take 300 mg by mouth daily.  . DOCOSAHEXAENOIC ACID PO Take 1 capsule by mouth daily.  Marland Kitchen glucosamine-chondroitin 500-400 MG tablet Take 1 tablet by mouth daily.  Marland Kitchen lisinopril-hydrochlorothiazide (PRINZIDE,ZESTORETIC) 20-12.5 MG tablet Take 1 tablet by mouth daily.  . montelukast (SINGULAIR) 10 MG tablet Take 10 mg by mouth daily.  Marland Kitchen omeprazole (PRILOSEC) 40 MG capsule Take 40 mg by mouth daily.  . rosuvastatin (CRESTOR) 20 MG tablet Take 20 mg by mouth daily.  . sertraline (ZOLOFT) 50 MG tablet Take 50 mg by mouth daily.  . Vitamin D, Ergocalciferol, (DRISDOL) 50000 units CAPS capsule Take 5,000 Units by mouth daily.     Allergies:   Sulfa antibiotics   Social History   Social History  . Marital status: Married    Spouse name: N/A  . Number of children: N/A  . Years of education: N/A   Social History Main Topics  . Smoking status: Never Smoker  . Smokeless tobacco: Never Used  . Alcohol use No  . Drug use: No  . Sexual activity: Not Asked   Other Topics Concern  . None  Social History Narrative  . None     Family History: The patient's family history includes Arthritis in her mother; Diabetes in her brother; Heart disease in her brother and mother; Hypertension in her mother; Thyroid disease in her mother.  ROS:   Please see the history of present illness.    All other systems reviewed and are negative.  EKGs/Labs/Other Studies Reviewed:    The following studies were reviewed today: I reviewed records from previous office visits and discussed with the patient at extensive length.   Recent Labs: No results found for requested labs  within last 8760 hours.  Recent Lipid Panel No results found for: CHOL, TRIG, HDL, CHOLHDL, VLDL, LDLCALC, LDLDIRECT  Physical Exam:    VS:  BP 124/70   Pulse 63   Ht 5\' 8"  (1.727 m)   Wt 191 lb (86.6 kg)   SpO2 96%   BMI 29.04 kg/m     Wt Readings from Last 3 Encounters:  08/26/17 191 lb (86.6 kg)     GEN: Patient is in no acute distress HEENT: Normal NECK: No JVD; No carotid bruits LYMPHATICS: No lymphadenopathy CARDIAC: S1 S2 regular, 2/6 systolic murmur at the apex. RESPIRATORY:  Clear to auscultation without rales, wheezing or rhonchi  ABDOMEN: Soft, non-tender, non-distended MUSCULOSKELETAL:  No edema; No deformity  SKIN: Warm and dry NEUROLOGIC:  Alert and oriented x 3 PSYCHIATRIC:  Normal affect    Signed, Jenean Lindau, MD  08/26/2017 12:21 PM    [facility]

## 2017-09-17 DIAGNOSIS — Z1231 Encounter for screening mammogram for malignant neoplasm of breast: Secondary | ICD-10-CM | POA: Diagnosis not present

## 2017-09-17 DIAGNOSIS — Z9181 History of falling: Secondary | ICD-10-CM | POA: Diagnosis not present

## 2017-09-17 DIAGNOSIS — E785 Hyperlipidemia, unspecified: Secondary | ICD-10-CM | POA: Diagnosis not present

## 2017-09-17 DIAGNOSIS — Z1331 Encounter for screening for depression: Secondary | ICD-10-CM | POA: Diagnosis not present

## 2017-09-17 DIAGNOSIS — Z0000 Encounter for general adult medical examination without abnormal findings: Secondary | ICD-10-CM | POA: Diagnosis not present

## 2017-12-08 DIAGNOSIS — Z1231 Encounter for screening mammogram for malignant neoplasm of breast: Secondary | ICD-10-CM | POA: Diagnosis not present

## 2017-12-17 DIAGNOSIS — R69 Illness, unspecified: Secondary | ICD-10-CM | POA: Diagnosis not present

## 2018-01-01 DIAGNOSIS — J209 Acute bronchitis, unspecified: Secondary | ICD-10-CM | POA: Diagnosis not present

## 2018-01-01 DIAGNOSIS — Z6829 Body mass index (BMI) 29.0-29.9, adult: Secondary | ICD-10-CM | POA: Diagnosis not present

## 2018-01-01 DIAGNOSIS — J019 Acute sinusitis, unspecified: Secondary | ICD-10-CM | POA: Diagnosis not present

## 2018-01-01 DIAGNOSIS — E663 Overweight: Secondary | ICD-10-CM | POA: Diagnosis not present

## 2018-01-01 DIAGNOSIS — I10 Essential (primary) hypertension: Secondary | ICD-10-CM | POA: Diagnosis not present

## 2018-01-12 DIAGNOSIS — I2510 Atherosclerotic heart disease of native coronary artery without angina pectoris: Secondary | ICD-10-CM | POA: Diagnosis not present

## 2018-01-12 DIAGNOSIS — E782 Mixed hyperlipidemia: Secondary | ICD-10-CM | POA: Diagnosis not present

## 2018-01-12 DIAGNOSIS — I10 Essential (primary) hypertension: Secondary | ICD-10-CM | POA: Diagnosis not present

## 2018-01-12 DIAGNOSIS — E559 Vitamin D deficiency, unspecified: Secondary | ICD-10-CM | POA: Diagnosis not present

## 2018-01-12 DIAGNOSIS — Z79899 Other long term (current) drug therapy: Secondary | ICD-10-CM | POA: Diagnosis not present

## 2018-01-12 DIAGNOSIS — E663 Overweight: Secondary | ICD-10-CM | POA: Diagnosis not present

## 2018-01-12 DIAGNOSIS — J309 Allergic rhinitis, unspecified: Secondary | ICD-10-CM | POA: Diagnosis not present

## 2018-01-12 DIAGNOSIS — Z6829 Body mass index (BMI) 29.0-29.9, adult: Secondary | ICD-10-CM | POA: Diagnosis not present

## 2018-01-12 DIAGNOSIS — N3281 Overactive bladder: Secondary | ICD-10-CM | POA: Diagnosis not present

## 2018-01-12 DIAGNOSIS — K219 Gastro-esophageal reflux disease without esophagitis: Secondary | ICD-10-CM | POA: Diagnosis not present

## 2018-02-05 DIAGNOSIS — H52223 Regular astigmatism, bilateral: Secondary | ICD-10-CM | POA: Diagnosis not present

## 2018-02-05 DIAGNOSIS — Z0100 Encounter for examination of eyes and vision without abnormal findings: Secondary | ICD-10-CM | POA: Diagnosis not present

## 2018-02-05 DIAGNOSIS — Z0101 Encounter for examination of eyes and vision with abnormal findings: Secondary | ICD-10-CM | POA: Diagnosis not present

## 2018-02-23 ENCOUNTER — Encounter: Payer: Self-pay | Admitting: Cardiology

## 2018-02-23 ENCOUNTER — Ambulatory Visit: Payer: Medicare Other | Admitting: Cardiology

## 2018-02-23 ENCOUNTER — Ambulatory Visit (INDEPENDENT_AMBULATORY_CARE_PROVIDER_SITE_OTHER): Payer: Medicare HMO | Admitting: Cardiology

## 2018-02-23 VITALS — BP 126/70 | HR 57 | Ht 68.0 in | Wt 198.0 lb

## 2018-02-23 DIAGNOSIS — I10 Essential (primary) hypertension: Secondary | ICD-10-CM

## 2018-02-23 DIAGNOSIS — I2510 Atherosclerotic heart disease of native coronary artery without angina pectoris: Secondary | ICD-10-CM | POA: Diagnosis not present

## 2018-02-23 DIAGNOSIS — E782 Mixed hyperlipidemia: Secondary | ICD-10-CM

## 2018-02-23 NOTE — Progress Notes (Signed)
Cardiology Office Note:    Date:  02/23/2018   ID:  Paige Walters, DOB 1946-02-03, MRN [id number]  PCP:  Nicholos Johns, MD  Cardiologist:  Jenean Lindau, MD   Referring MD: Nicholos Johns, MD    ASSESSMENT:    1. Coronary artery disease involving native coronary artery of native heart without angina pectoris   2. Essential hypertension   3. Mixed hyperlipidemia    PLAN:    In order of problems listed above:  1. Secondary prevention stressed with the patient.  Importance of compliance with diet and medications stressed and she vocalized understanding.  Her blood pressure stable.  Weight reduction was advised diet and exercise was outlined.  I will try to get a copy of her blood work from her primary care physician.  Patient will be seen in follow-up appointment in 6 months or earlier if she has any concerns and secondary prevention was stressed at length.   Medication Adjustments/Labs and Tests Ordered: Current medicines are reviewed at length with the patient today.  Concerns regarding medicines are outlined above.  No orders of the defined types were placed in this encounter.  No orders of the defined types were placed in this encounter.    Chief Complaint  Patient presents with  . Follow-up  . Coronary Artery Disease     History of Present Illness:    Paige Walters is a 72 y.o. female.  Patient has known coronary artery disease and essential hypertension and dyslipidemia.  She denies any problems at this time and takes care of activities of daily living.  No chest pain orthopnea or PND.  She is active but does not exercise as prescribed.  The patient has gained about 8-9 pounds in the past 6 months.  At the time of my evaluation, the patient is alert awake oriented and in no distress.  Past Medical History:  Diagnosis Date  . CAD (coronary artery disease)   . GERD (gastroesophageal reflux disease)   . Hyperlipidemia   . Hypertension   . Overactive bladder   . Vitamin D  deficiency     Past Surgical History:  Procedure Laterality Date  . CORONARY ARTERY BYPASS GRAFT      Current Medications: Current Meds  Medication Sig  . aspirin EC 81 MG tablet Take 162 mg by mouth daily.  . Coenzyme Q-10 200 MG CAPS Take 300 mg by mouth daily.  Marland Kitchen glucosamine-chondroitin 500-400 MG tablet Take 1 tablet by mouth daily.  Marland Kitchen lisinopril-hydrochlorothiazide (PRINZIDE,ZESTORETIC) 20-12.5 MG tablet Take 1 tablet by mouth daily.  Marland Kitchen omeprazole (PRILOSEC) 40 MG capsule Take 40 mg by mouth daily.  . rosuvastatin (CRESTOR) 20 MG tablet Take 20 mg by mouth daily.  . sertraline (ZOLOFT) 50 MG tablet Take 50 mg by mouth daily.  . Vitamin D, Ergocalciferol, (DRISDOL) 50000 units CAPS capsule Take 5,000 Units by mouth daily.  . [DISCONTINUED] DOCOSAHEXAENOIC ACID PO Take 1 capsule by mouth daily.  . [DISCONTINUED] montelukast (SINGULAIR) 10 MG tablet Take 10 mg by mouth daily.     Allergies:   Sulfa antibiotics   Social History   Socioeconomic History  . Marital status: Married    Spouse name: Not on file  . Number of children: Not on file  . Years of education: Not on file  . Highest education level: Not on file  Occupational History  . Not on file  Social Needs  . Financial resource strain: Not on file  . Food insecurity:  Worry: Not on file    Inability: Not on file  . Transportation needs:    Medical: Not on file    Non-medical: Not on file  Tobacco Use  . Smoking status: Never Smoker  . Smokeless tobacco: Never Used  Substance and Sexual Activity  . Alcohol use: No  . Drug use: No  . Sexual activity: Not on file  Lifestyle  . Physical activity:    Days per week: Not on file    Minutes per session: Not on file  . Stress: Not on file  Relationships  . Social connections:    Talks on phone: Not on file    Gets together: Not on file    Attends religious service: Not on file    Active member of club or organization: Not on file    Attends meetings of  clubs or organizations: Not on file    Relationship status: Not on file  Other Topics Concern  . Not on file  Social History Narrative  . Not on file     Family History: The patient's family history includes Arthritis in her mother; Diabetes in her brother; Heart disease in her brother and mother; Hypertension in her mother; Thyroid disease in her mother.  ROS:   Please see the history of present illness.    All other systems reviewed and are negative.  EKGs/Labs/Other Studies Reviewed:    The following studies were reviewed today: I reviewed records with her at length.   Recent Labs: No results found for requested labs within last 8760 hours.  Recent Lipid Panel No results found for: CHOL, TRIG, HDL, CHOLHDL, VLDL, LDLCALC, LDLDIRECT  Physical Exam:    VS:  BP 126/70 (BP Location: Right Arm, Patient Position: Sitting, Cuff Size: Normal)   Pulse (!) 57   Ht 5\' 8"  (1.727 m)   Wt 198 lb (89.8 kg)   SpO2 98%   BMI 30.11 kg/m     Wt Readings from Last 3 Encounters:  02/23/18 198 lb (89.8 kg)  08/26/17 191 lb (86.6 kg)     GEN: Patient is in no acute distress HEENT: Normal NECK: No JVD; No carotid bruits LYMPHATICS: No lymphadenopathy CARDIAC: Hear sounds regular, 2/6 systolic murmur at the apex. RESPIRATORY:  Clear to auscultation without rales, wheezing or rhonchi  ABDOMEN: Soft, non-tender, non-distended MUSCULOSKELETAL:  No edema; No deformity  SKIN: Warm and dry NEUROLOGIC:  Alert and oriented x 3 PSYCHIATRIC:  Normal affect   Signed, Jenean Lindau, MD  02/23/2018 2:59 PM    [place]

## 2018-02-23 NOTE — Patient Instructions (Signed)

## 2018-05-01 DIAGNOSIS — K219 Gastro-esophageal reflux disease without esophagitis: Secondary | ICD-10-CM | POA: Diagnosis not present

## 2018-05-01 DIAGNOSIS — G8929 Other chronic pain: Secondary | ICD-10-CM | POA: Diagnosis not present

## 2018-05-01 DIAGNOSIS — J309 Allergic rhinitis, unspecified: Secondary | ICD-10-CM | POA: Diagnosis not present

## 2018-05-01 DIAGNOSIS — R69 Illness, unspecified: Secondary | ICD-10-CM | POA: Diagnosis not present

## 2018-05-01 DIAGNOSIS — I10 Essential (primary) hypertension: Secondary | ICD-10-CM | POA: Diagnosis not present

## 2018-05-01 DIAGNOSIS — E785 Hyperlipidemia, unspecified: Secondary | ICD-10-CM | POA: Diagnosis not present

## 2018-05-01 DIAGNOSIS — E669 Obesity, unspecified: Secondary | ICD-10-CM | POA: Diagnosis not present

## 2018-05-01 DIAGNOSIS — I2510 Atherosclerotic heart disease of native coronary artery without angina pectoris: Secondary | ICD-10-CM | POA: Diagnosis not present

## 2018-05-01 DIAGNOSIS — I499 Cardiac arrhythmia, unspecified: Secondary | ICD-10-CM | POA: Diagnosis not present

## 2018-05-06 DIAGNOSIS — Z131 Encounter for screening for diabetes mellitus: Secondary | ICD-10-CM | POA: Diagnosis not present

## 2018-05-06 DIAGNOSIS — E559 Vitamin D deficiency, unspecified: Secondary | ICD-10-CM | POA: Diagnosis not present

## 2018-05-06 DIAGNOSIS — E663 Overweight: Secondary | ICD-10-CM | POA: Diagnosis not present

## 2018-05-06 DIAGNOSIS — N3281 Overactive bladder: Secondary | ICD-10-CM | POA: Diagnosis not present

## 2018-05-06 DIAGNOSIS — K2970 Gastritis, unspecified, without bleeding: Secondary | ICD-10-CM | POA: Diagnosis not present

## 2018-05-06 DIAGNOSIS — K219 Gastro-esophageal reflux disease without esophagitis: Secondary | ICD-10-CM | POA: Diagnosis not present

## 2018-05-06 DIAGNOSIS — J309 Allergic rhinitis, unspecified: Secondary | ICD-10-CM | POA: Diagnosis not present

## 2018-05-06 DIAGNOSIS — I10 Essential (primary) hypertension: Secondary | ICD-10-CM | POA: Diagnosis not present

## 2018-05-06 DIAGNOSIS — Z6829 Body mass index (BMI) 29.0-29.9, adult: Secondary | ICD-10-CM | POA: Diagnosis not present

## 2018-05-06 DIAGNOSIS — Z1339 Encounter for screening examination for other mental health and behavioral disorders: Secondary | ICD-10-CM | POA: Diagnosis not present

## 2018-05-06 DIAGNOSIS — R739 Hyperglycemia, unspecified: Secondary | ICD-10-CM | POA: Diagnosis not present

## 2018-07-01 DIAGNOSIS — H02054 Trichiasis without entropian left upper eyelid: Secondary | ICD-10-CM | POA: Diagnosis not present

## 2018-08-10 DIAGNOSIS — K219 Gastro-esophageal reflux disease without esophagitis: Secondary | ICD-10-CM | POA: Diagnosis not present

## 2018-08-10 DIAGNOSIS — I10 Essential (primary) hypertension: Secondary | ICD-10-CM | POA: Diagnosis not present

## 2018-08-10 DIAGNOSIS — E559 Vitamin D deficiency, unspecified: Secondary | ICD-10-CM | POA: Diagnosis not present

## 2018-08-10 DIAGNOSIS — E782 Mixed hyperlipidemia: Secondary | ICD-10-CM | POA: Diagnosis not present

## 2018-08-10 DIAGNOSIS — Z1331 Encounter for screening for depression: Secondary | ICD-10-CM | POA: Diagnosis not present

## 2018-08-10 DIAGNOSIS — E663 Overweight: Secondary | ICD-10-CM | POA: Diagnosis not present

## 2018-08-10 DIAGNOSIS — Z6829 Body mass index (BMI) 29.0-29.9, adult: Secondary | ICD-10-CM | POA: Diagnosis not present

## 2018-08-10 DIAGNOSIS — Z23 Encounter for immunization: Secondary | ICD-10-CM | POA: Diagnosis not present

## 2018-08-10 DIAGNOSIS — Z79899 Other long term (current) drug therapy: Secondary | ICD-10-CM | POA: Diagnosis not present

## 2018-08-10 DIAGNOSIS — J309 Allergic rhinitis, unspecified: Secondary | ICD-10-CM | POA: Diagnosis not present

## 2018-10-28 DIAGNOSIS — Z1239 Encounter for other screening for malignant neoplasm of breast: Secondary | ICD-10-CM | POA: Diagnosis not present

## 2018-10-28 DIAGNOSIS — Z9181 History of falling: Secondary | ICD-10-CM | POA: Diagnosis not present

## 2018-10-28 DIAGNOSIS — E785 Hyperlipidemia, unspecified: Secondary | ICD-10-CM | POA: Diagnosis not present

## 2018-10-28 DIAGNOSIS — Z0000 Encounter for general adult medical examination without abnormal findings: Secondary | ICD-10-CM | POA: Diagnosis not present

## 2018-10-28 DIAGNOSIS — Z139 Encounter for screening, unspecified: Secondary | ICD-10-CM | POA: Diagnosis not present

## 2018-10-28 DIAGNOSIS — Z1331 Encounter for screening for depression: Secondary | ICD-10-CM | POA: Diagnosis not present

## 2018-11-24 ENCOUNTER — Encounter: Payer: Self-pay | Admitting: Cardiology

## 2018-11-24 ENCOUNTER — Ambulatory Visit (INDEPENDENT_AMBULATORY_CARE_PROVIDER_SITE_OTHER): Payer: Medicare HMO | Admitting: Cardiology

## 2018-11-24 VITALS — BP 122/60 | HR 61 | Ht 68.0 in | Wt 192.0 lb

## 2018-11-24 DIAGNOSIS — I10 Essential (primary) hypertension: Secondary | ICD-10-CM

## 2018-11-24 DIAGNOSIS — E782 Mixed hyperlipidemia: Secondary | ICD-10-CM | POA: Diagnosis not present

## 2018-11-24 DIAGNOSIS — I2510 Atherosclerotic heart disease of native coronary artery without angina pectoris: Secondary | ICD-10-CM | POA: Diagnosis not present

## 2018-11-24 NOTE — Progress Notes (Signed)
Cardiology Office Note:    Date:  11/24/2018   ID:  Paige Walters, DOB [birth year], MRN [id number]  PCP:  Paige Johns, MD  Cardiologist:  Paige Lindau, MD   Referring MD: Paige Johns, MD    ASSESSMENT:    1. Coronary artery disease involving native coronary artery of native heart without angina pectoris   2. Mixed hyperlipidemia   3. Essential hypertension    PLAN:    In order of problems listed above:  1. EKG reveals sinus rhythm and nonspecific ST-T changes. 2. Secondary prevention stressed with the patient.  Importance of compliance with diet and medication stressed and she vocalized understanding.  Her blood pressure is stable and she exercises regularly. 3. She plans to have blood work by her primary care physician in the next week or two. 4. Patient will be seen in follow-up appointment in 6 months or earlier if the patient has any concerns    Medication Adjustments/Labs and Tests Ordered: Current medicines are reviewed at length with the patient today.  Concerns regarding medicines are outlined above.  No orders of the defined types were placed in this encounter.  No orders of the defined types were placed in this encounter.    No chief complaint on file.    History of Present Illness:    Paige Walters is a 73 y.o. female.  Patient has history of essential hypertension and dyslipidemia.  She has known coronary artery disease.  She denies any problems at this time and takes care of activities of daily living.  No chest pain orthopnea or PND.  At the time of my evaluation, the patient is alert awake oriented and in no distress.  Past Medical History:  Diagnosis Date  . CAD (coronary artery disease)   . GERD (gastroesophageal reflux disease)   . Hyperlipidemia   . Hypertension   . Overactive bladder   . Vitamin D deficiency     Past Surgical History:  Procedure Laterality Date  . CORONARY ARTERY BYPASS GRAFT      Current Medications: Current Meds    Medication Sig  . aspirin EC 81 MG tablet Take 162 mg by mouth daily.  . Coenzyme Q-10 200 MG CAPS Take 300 mg by mouth daily.  Marland Kitchen glucosamine-chondroitin 500-400 MG tablet Take 1 tablet by mouth daily.  Marland Kitchen lisinopril-hydrochlorothiazide (PRINZIDE,ZESTORETIC) 20-12.5 MG tablet Take 1 tablet by mouth daily.  Marland Kitchen omeprazole (PRILOSEC) 40 MG capsule Take 40 mg by mouth daily.  . rosuvastatin (CRESTOR) 20 MG tablet Take 20 mg by mouth daily.  . sertraline (ZOLOFT) 50 MG tablet Take 50 mg by mouth as needed.   . Vitamin D, Ergocalciferol, (DRISDOL) 50000 units CAPS capsule Take 5,000 Units by mouth daily.     Allergies:   Sulfa antibiotics   Social History   Socioeconomic History  . Marital status: Married    Spouse name: Not on file  . Number of children: Not on file  . Years of education: Not on file  . Highest education level: Not on file  Occupational History  . Not on file  Social Needs  . Financial resource strain: Not on file  . Food insecurity:    Worry: Not on file    Inability: Not on file  . Transportation needs:    Medical: Not on file    Non-medical: Not on file  Tobacco Use  . Smoking status: Never Smoker  . Smokeless tobacco: Never Used  Substance and Sexual Activity  . Alcohol  use: No  . Drug use: No  . Sexual activity: Not on file  Lifestyle  . Physical activity:    Days per week: Not on file    Minutes per session: Not on file  . Stress: Not on file  Relationships  . Social connections:    Talks on phone: Not on file    Gets together: Not on file    Attends religious service: Not on file    Active member of club or organization: Not on file    Attends meetings of clubs or organizations: Not on file    Relationship status: Not on file  Other Topics Concern  . Not on file  Social History Narrative  . Not on file     Family History: The patient's family history includes Arthritis in her mother; Diabetes in her brother; Heart disease in her brother and  mother; Hypertension in her mother; Thyroid disease in her mother.  ROS:   Please see the history of present illness.    All other systems reviewed and are negative.  EKGs/Labs/Other Studies Reviewed:    The following studies were reviewed today: At I discussed my findings with the patient at length.   Recent Labs: No results found for requested labs within last 8760 hours.  Recent Lipid Panel No results found for: CHOL, TRIG, HDL, CHOLHDL, VLDL, LDLCALC, LDLDIRECT  Physical Exam:    VS:  BP 122/60 (BP Location: Right Arm, Patient Position: Sitting, Cuff Size: Normal)   Pulse 61   Ht 5\' 8"  (1.727 m)   Wt 192 lb (87.1 kg)   SpO2 95%   BMI 29.19 kg/m     Wt Readings from Last 3 Encounters:  11/24/18 192 lb (87.1 kg)  02/23/18 198 lb (89.8 kg)  08/26/17 191 lb (86.6 kg)     GEN: Patient is in no acute distress HEENT: Normal NECK: No JVD; No carotid bruits LYMPHATICS: No lymphadenopathy CARDIAC: Hear sounds regular, 2/6 systolic murmur at the apex. RESPIRATORY:  Clear to auscultation without rales, wheezing or rhonchi  ABDOMEN: Soft, non-tender, non-distended MUSCULOSKELETAL:  No edema; No deformity  SKIN: Warm and dry NEUROLOGIC:  Alert and oriented x 3 PSYCHIATRIC:  Normal affect   Signed, Paige Lindau, MD  11/24/2018 10:28 AM    [place]

## 2018-11-24 NOTE — Patient Instructions (Signed)
Medication Instructions:  Your physician recommends that you continue on your current medications as directed. Please refer to the Current Medication list given to you today.  If you need a refill on your cardiac medications before your next appointment, please call your pharmacy.   Lab work: None If you have labs (blood work) drawn today and your tests are completely normal, you will receive your results only by: . MyChart Message (if you have MyChart) OR . A paper copy in the mail If you have any lab test that is abnormal or we need to change your treatment, we will call you to review the results.  Testing/Procedures: None  Follow-Up: At [facility], you and your health needs are our priority.  As part of our continuing mission to provide you with exceptional heart care, we have created designated Provider Care Teams.  These Care Teams include your primary Cardiologist (physician) and Advanced Practice Providers (APPs -  Physician Assistants and Nurse Practitioners) who all work together to provide you with the care you need, when you need it. You will need a follow up appointment in 6 months.  Please call our office 2 months in advance to schedule this appointment.  You may see No primary care provider on file. or another member of our [facility] Provider Team in [place]: Robert Krasowski, MD . Brian Munley, MD  Any Other Special Instructions Will Be Listed Below (If Applicable).    

## 2018-12-09 DIAGNOSIS — Z1231 Encounter for screening mammogram for malignant neoplasm of breast: Secondary | ICD-10-CM | POA: Diagnosis not present

## 2018-12-14 DIAGNOSIS — E559 Vitamin D deficiency, unspecified: Secondary | ICD-10-CM | POA: Diagnosis not present

## 2018-12-14 DIAGNOSIS — E663 Overweight: Secondary | ICD-10-CM | POA: Diagnosis not present

## 2018-12-14 DIAGNOSIS — J309 Allergic rhinitis, unspecified: Secondary | ICD-10-CM | POA: Diagnosis not present

## 2018-12-14 DIAGNOSIS — E782 Mixed hyperlipidemia: Secondary | ICD-10-CM | POA: Diagnosis not present

## 2018-12-14 DIAGNOSIS — Z79899 Other long term (current) drug therapy: Secondary | ICD-10-CM | POA: Diagnosis not present

## 2018-12-14 DIAGNOSIS — N3281 Overactive bladder: Secondary | ICD-10-CM | POA: Diagnosis not present

## 2018-12-14 DIAGNOSIS — K219 Gastro-esophageal reflux disease without esophagitis: Secondary | ICD-10-CM | POA: Diagnosis not present

## 2018-12-14 DIAGNOSIS — Z6829 Body mass index (BMI) 29.0-29.9, adult: Secondary | ICD-10-CM | POA: Diagnosis not present

## 2018-12-14 DIAGNOSIS — I2510 Atherosclerotic heart disease of native coronary artery without angina pectoris: Secondary | ICD-10-CM | POA: Diagnosis not present

## 2018-12-14 DIAGNOSIS — I10 Essential (primary) hypertension: Secondary | ICD-10-CM | POA: Diagnosis not present

## 2019-04-15 DIAGNOSIS — E782 Mixed hyperlipidemia: Secondary | ICD-10-CM | POA: Diagnosis not present

## 2019-04-15 DIAGNOSIS — I10 Essential (primary) hypertension: Secondary | ICD-10-CM | POA: Diagnosis not present

## 2019-04-15 DIAGNOSIS — R69 Illness, unspecified: Secondary | ICD-10-CM | POA: Diagnosis not present

## 2019-04-15 DIAGNOSIS — K219 Gastro-esophageal reflux disease without esophagitis: Secondary | ICD-10-CM | POA: Diagnosis not present

## 2019-04-15 DIAGNOSIS — E663 Overweight: Secondary | ICD-10-CM | POA: Diagnosis not present

## 2019-04-15 DIAGNOSIS — Z6828 Body mass index (BMI) 28.0-28.9, adult: Secondary | ICD-10-CM | POA: Diagnosis not present

## 2019-04-15 DIAGNOSIS — E559 Vitamin D deficiency, unspecified: Secondary | ICD-10-CM | POA: Diagnosis not present

## 2019-04-15 DIAGNOSIS — J309 Allergic rhinitis, unspecified: Secondary | ICD-10-CM | POA: Diagnosis not present

## 2019-04-15 DIAGNOSIS — N3281 Overactive bladder: Secondary | ICD-10-CM | POA: Diagnosis not present

## 2019-07-21 DIAGNOSIS — E782 Mixed hyperlipidemia: Secondary | ICD-10-CM | POA: Diagnosis not present

## 2019-07-21 DIAGNOSIS — N3281 Overactive bladder: Secondary | ICD-10-CM | POA: Diagnosis not present

## 2019-07-21 DIAGNOSIS — I2510 Atherosclerotic heart disease of native coronary artery without angina pectoris: Secondary | ICD-10-CM | POA: Diagnosis not present

## 2019-07-21 DIAGNOSIS — I10 Essential (primary) hypertension: Secondary | ICD-10-CM | POA: Diagnosis not present

## 2019-07-21 DIAGNOSIS — E559 Vitamin D deficiency, unspecified: Secondary | ICD-10-CM | POA: Diagnosis not present

## 2019-07-21 DIAGNOSIS — Z79899 Other long term (current) drug therapy: Secondary | ICD-10-CM | POA: Diagnosis not present

## 2019-09-02 DIAGNOSIS — R69 Illness, unspecified: Secondary | ICD-10-CM | POA: Diagnosis not present

## 2019-09-16 ENCOUNTER — Encounter: Payer: Self-pay | Admitting: Cardiology

## 2019-09-16 ENCOUNTER — Ambulatory Visit (INDEPENDENT_AMBULATORY_CARE_PROVIDER_SITE_OTHER): Payer: Medicare HMO | Admitting: Cardiology

## 2019-09-16 ENCOUNTER — Other Ambulatory Visit: Payer: Self-pay

## 2019-09-16 VITALS — BP 132/64 | HR 68 | Ht 68.0 in | Wt 189.2 lb

## 2019-09-16 DIAGNOSIS — E782 Mixed hyperlipidemia: Secondary | ICD-10-CM | POA: Diagnosis not present

## 2019-09-16 DIAGNOSIS — I10 Essential (primary) hypertension: Secondary | ICD-10-CM

## 2019-09-16 DIAGNOSIS — I2510 Atherosclerotic heart disease of native coronary artery without angina pectoris: Secondary | ICD-10-CM

## 2019-09-16 NOTE — Progress Notes (Signed)
Cardiology Office Note:    Date:  09/16/2019   ID:  Paige Walters, DOB 1946/04/11, MRN [id number]  PCP:  Nicholos Johns, MD  Cardiologist:  Jenean Lindau, MD   Referring MD: Nicholos Johns, MD    ASSESSMENT:    1. Coronary artery disease involving native coronary artery of native heart without angina pectoris   2. Essential hypertension   3. Mixed hyperlipidemia    PLAN:    In order of problems listed above:  1. Coronary artery disease: Secondary prevention stressed with the patient.  Importance of compliance with diet and medication stressed and she vocalized understanding. 2. Essential hypertension: Blood pressure stable 3. Mixed dyslipidemia: Diet was discussed lipids were reviewed from the [facility] sheet 4. Patient will be seen in follow-up appointment in 6 months or earlier if the patient has any concerns    Medication Adjustments/Labs and Tests Ordered: Current medicines are reviewed at length with the patient today.  Concerns regarding medicines are outlined above.  No orders of the defined types were placed in this encounter.  No orders of the defined types were placed in this encounter.    No chief complaint on file.    History of Present Illness:    Paige Walters is a 73 y.o. female.  Patient has past medical history of coronary artery disease, essential hypertension and dyslipidemia.  She denies any problems at this time and takes care of activities of daily living.  No chest pain orthopnea or PND.  She walks on a regular basis.  At the time of my evaluation, the patient is alert awake oriented and in no distress.  Past Medical History:  Diagnosis Date  . CAD (coronary artery disease)   . GERD (gastroesophageal reflux disease)   . Hyperlipidemia   . Hypertension   . Overactive bladder   . Vitamin D deficiency     Past Surgical History:  Procedure Laterality Date  . CORONARY ARTERY BYPASS GRAFT      Current Medications: Current Meds  Medication Sig  . aspirin  EC 81 MG tablet Take 162 mg by mouth daily.  . Coenzyme Q-10 200 MG CAPS Take 300 mg by mouth daily.  Marland Kitchen glucosamine-chondroitin 500-400 MG tablet Take 1 tablet by mouth daily.  Marland Kitchen losartan-hydrochlorothiazide (HYZAAR) 100-12.5 MG tablet   . Multiple Vitamin (MULTIVITAMIN) tablet Take 1 tablet by mouth daily.  Marland Kitchen omeprazole (PRILOSEC) 40 MG capsule Take 40 mg by mouth daily.  . rosuvastatin (CRESTOR) 20 MG tablet Take 20 mg by mouth daily.  . sertraline (ZOLOFT) 50 MG tablet Take 50 mg by mouth as needed.   . Vitamin D, Ergocalciferol, (DRISDOL) 50000 units CAPS capsule Take 5,000 Units by mouth daily.  . [DISCONTINUED] lisinopril-hydrochlorothiazide (PRINZIDE,ZESTORETIC) 20-12.5 MG tablet Take 1 tablet by mouth daily.     Allergies:   Sulfa antibiotics   Social History   Socioeconomic History  . Marital status: Married    Spouse name: Not on file  . Number of children: Not on file  . Years of education: Not on file  . Highest education level: Not on file  Occupational History  . Not on file  Social Needs  . Financial resource strain: Not on file  . Food insecurity    Worry: Not on file    Inability: Not on file  . Transportation needs    Medical: Not on file    Non-medical: Not on file  Tobacco Use  . Smoking status: Never Smoker  . Smokeless tobacco:  Never Used  Substance and Sexual Activity  . Alcohol use: No  . Drug use: No  . Sexual activity: Not on file  Lifestyle  . Physical activity    Days per week: Not on file    Minutes per session: Not on file  . Stress: Not on file  Relationships  . Social Herbalist on phone: Not on file    Gets together: Not on file    Attends religious service: Not on file    Active member of club or organization: Not on file    Attends meetings of clubs or organizations: Not on file    Relationship status: Not on file  Other Topics Concern  . Not on file  Social History Narrative  . Not on file     Family History: The  patient's family history includes Arthritis in her mother; Diabetes in her brother; Heart disease in her brother and mother; Hypertension in her mother; Thyroid disease in her mother.  ROS:   Please see the history of present illness.    All other systems reviewed and are negative.  EKGs/Labs/Other Studies Reviewed:    The following studies were reviewed today: I discussed my findings with the patient at extensive length including lab work including lipids   Recent Labs: No results found for requested labs within last 8760 hours.  Recent Lipid Panel No results found for: CHOL, TRIG, HDL, CHOLHDL, VLDL, LDLCALC, LDLDIRECT  Physical Exam:    VS:  BP 132/64 (BP Location: Left Arm, Patient Position: Sitting, Cuff Size: Normal)   Pulse 68   Ht 5\' 8"  (1.727 m)   Wt 189 lb 3.2 oz (85.8 kg)   SpO2 98%   BMI 28.77 kg/m     Wt Readings from Last 3 Encounters:  09/16/19 189 lb 3.2 oz (85.8 kg)  11/24/18 192 lb (87.1 kg)  02/23/18 198 lb (89.8 kg)     GEN: Patient is in no acute distress HEENT: Normal NECK: No JVD; No carotid bruits LYMPHATICS: No lymphadenopathy CARDIAC: Hear sounds regular, 2/6 systolic murmur at the apex. RESPIRATORY:  Clear to auscultation without rales, wheezing or rhonchi  ABDOMEN: Soft, non-tender, non-distended MUSCULOSKELETAL:  No edema; No deformity  SKIN: Warm and dry NEUROLOGIC:  Alert and oriented x 3 PSYCHIATRIC:  Normal affect   Signed, Jenean Lindau, MD  09/16/2019 3:58 PM    [facility]

## 2019-09-16 NOTE — Patient Instructions (Signed)

## 2019-11-01 DIAGNOSIS — Z20828 Contact with and (suspected) exposure to other viral communicable diseases: Secondary | ICD-10-CM | POA: Diagnosis not present

## 2019-11-01 DIAGNOSIS — R05 Cough: Secondary | ICD-10-CM | POA: Diagnosis not present

## 2019-11-02 DIAGNOSIS — Z0000 Encounter for general adult medical examination without abnormal findings: Secondary | ICD-10-CM | POA: Diagnosis not present

## 2019-11-02 DIAGNOSIS — E785 Hyperlipidemia, unspecified: Secondary | ICD-10-CM | POA: Diagnosis not present

## 2019-11-02 DIAGNOSIS — Z1331 Encounter for screening for depression: Secondary | ICD-10-CM | POA: Diagnosis not present

## 2019-11-02 DIAGNOSIS — Z139 Encounter for screening, unspecified: Secondary | ICD-10-CM | POA: Diagnosis not present

## 2019-11-02 DIAGNOSIS — Z9181 History of falling: Secondary | ICD-10-CM | POA: Diagnosis not present

## 2019-11-25 DIAGNOSIS — K219 Gastro-esophageal reflux disease without esophagitis: Secondary | ICD-10-CM | POA: Diagnosis not present

## 2019-11-25 DIAGNOSIS — I2510 Atherosclerotic heart disease of native coronary artery without angina pectoris: Secondary | ICD-10-CM | POA: Diagnosis not present

## 2019-11-25 DIAGNOSIS — I10 Essential (primary) hypertension: Secondary | ICD-10-CM | POA: Diagnosis not present

## 2019-11-25 DIAGNOSIS — N3281 Overactive bladder: Secondary | ICD-10-CM | POA: Diagnosis not present

## 2019-11-25 DIAGNOSIS — J309 Allergic rhinitis, unspecified: Secondary | ICD-10-CM | POA: Diagnosis not present

## 2019-11-25 DIAGNOSIS — Z79899 Other long term (current) drug therapy: Secondary | ICD-10-CM | POA: Diagnosis not present

## 2019-11-25 DIAGNOSIS — E559 Vitamin D deficiency, unspecified: Secondary | ICD-10-CM | POA: Diagnosis not present

## 2019-11-25 DIAGNOSIS — E782 Mixed hyperlipidemia: Secondary | ICD-10-CM | POA: Diagnosis not present

## 2019-11-25 DIAGNOSIS — Z6828 Body mass index (BMI) 28.0-28.9, adult: Secondary | ICD-10-CM | POA: Diagnosis not present

## 2020-03-01 DIAGNOSIS — Z79899 Other long term (current) drug therapy: Secondary | ICD-10-CM | POA: Diagnosis not present

## 2020-03-01 DIAGNOSIS — I10 Essential (primary) hypertension: Secondary | ICD-10-CM | POA: Diagnosis not present

## 2020-03-01 DIAGNOSIS — J309 Allergic rhinitis, unspecified: Secondary | ICD-10-CM | POA: Diagnosis not present

## 2020-03-01 DIAGNOSIS — E559 Vitamin D deficiency, unspecified: Secondary | ICD-10-CM | POA: Diagnosis not present

## 2020-03-01 DIAGNOSIS — K219 Gastro-esophageal reflux disease without esophagitis: Secondary | ICD-10-CM | POA: Diagnosis not present

## 2020-03-01 DIAGNOSIS — E782 Mixed hyperlipidemia: Secondary | ICD-10-CM | POA: Diagnosis not present

## 2020-03-01 DIAGNOSIS — Z6828 Body mass index (BMI) 28.0-28.9, adult: Secondary | ICD-10-CM | POA: Diagnosis not present

## 2020-03-01 DIAGNOSIS — N3281 Overactive bladder: Secondary | ICD-10-CM | POA: Diagnosis not present

## 2020-03-01 DIAGNOSIS — I2510 Atherosclerotic heart disease of native coronary artery without angina pectoris: Secondary | ICD-10-CM | POA: Diagnosis not present

## 2020-03-07 DIAGNOSIS — Z1231 Encounter for screening mammogram for malignant neoplasm of breast: Secondary | ICD-10-CM | POA: Diagnosis not present

## 2020-06-19 DIAGNOSIS — S9002XA Contusion of left ankle, initial encounter: Secondary | ICD-10-CM | POA: Diagnosis not present

## 2020-07-04 DIAGNOSIS — E782 Mixed hyperlipidemia: Secondary | ICD-10-CM | POA: Diagnosis not present

## 2020-07-04 DIAGNOSIS — Z79899 Other long term (current) drug therapy: Secondary | ICD-10-CM | POA: Diagnosis not present

## 2020-07-04 DIAGNOSIS — E559 Vitamin D deficiency, unspecified: Secondary | ICD-10-CM | POA: Diagnosis not present

## 2020-07-04 DIAGNOSIS — Z6828 Body mass index (BMI) 28.0-28.9, adult: Secondary | ICD-10-CM | POA: Diagnosis not present

## 2020-07-04 DIAGNOSIS — K219 Gastro-esophageal reflux disease without esophagitis: Secondary | ICD-10-CM | POA: Diagnosis not present

## 2020-07-04 DIAGNOSIS — N3281 Overactive bladder: Secondary | ICD-10-CM | POA: Diagnosis not present

## 2020-07-04 DIAGNOSIS — I2510 Atherosclerotic heart disease of native coronary artery without angina pectoris: Secondary | ICD-10-CM | POA: Diagnosis not present

## 2020-07-04 DIAGNOSIS — J309 Allergic rhinitis, unspecified: Secondary | ICD-10-CM | POA: Diagnosis not present

## 2020-07-04 DIAGNOSIS — I10 Essential (primary) hypertension: Secondary | ICD-10-CM | POA: Diagnosis not present

## 2020-08-17 DIAGNOSIS — E559 Vitamin D deficiency, unspecified: Secondary | ICD-10-CM | POA: Insufficient documentation

## 2020-08-17 DIAGNOSIS — K219 Gastro-esophageal reflux disease without esophagitis: Secondary | ICD-10-CM | POA: Insufficient documentation

## 2020-08-17 DIAGNOSIS — N3281 Overactive bladder: Secondary | ICD-10-CM | POA: Insufficient documentation

## 2020-08-17 DIAGNOSIS — I2510 Atherosclerotic heart disease of native coronary artery without angina pectoris: Secondary | ICD-10-CM | POA: Insufficient documentation

## 2020-08-18 ENCOUNTER — Other Ambulatory Visit: Payer: Self-pay

## 2020-08-18 ENCOUNTER — Encounter: Payer: Self-pay | Admitting: Cardiology

## 2020-08-18 ENCOUNTER — Ambulatory Visit: Payer: Medicare HMO | Admitting: Cardiology

## 2020-08-18 VITALS — BP 138/70 | HR 62 | Ht 68.0 in | Wt 193.0 lb

## 2020-08-18 DIAGNOSIS — E782 Mixed hyperlipidemia: Secondary | ICD-10-CM

## 2020-08-18 DIAGNOSIS — I2510 Atherosclerotic heart disease of native coronary artery without angina pectoris: Secondary | ICD-10-CM

## 2020-08-18 DIAGNOSIS — I10 Essential (primary) hypertension: Secondary | ICD-10-CM

## 2020-08-18 HISTORY — DX: Essential (primary) hypertension: I10

## 2020-08-18 MED ORDER — NITROGLYCERIN 0.4 MG SL SUBL: 0.4000 mg | SUBLINGUAL_TABLET | SUBLINGUAL | 3 refills | Status: DC | PRN

## 2020-08-18 NOTE — Progress Notes (Signed)
Cardiology Office Note:    Date:  08/18/2020   ID:  Paige Walters, DOB [birth year], MRN [id number]  PCP:  Nicholos Johns, MD  Cardiologist:  Jenean Lindau, MD   Referring MD: Nicholos Johns, MD    ASSESSMENT:    1. Coronary artery disease involving native coronary artery of native heart without angina pectoris   2. Mixed hyperlipidemia    PLAN:    In order of problems listed above:  1. Coronary artery disease: Secondary prevention stressed with the patient. Importance of compliance with diet medication stressed and she vocalized understanding. She was advised to walk at least half an hour a day twice a week and she promises to do so. 2. Mixed dyslipidemia: Diet was emphasized. Lipids followed by primary care physician some numbers available on KPN sheets were unremarkable. 3. Patient will be seen in follow-up appointment in 6 months or earlier if the patient has any concerns 4. Sublingual nitroglycerin prescription was sent, its protocol and 911 protocol explained and the patient vocalized understanding questions were answered to the patient's satisfaction   Medication Adjustments/Labs and Tests Ordered: Current medicines are reviewed at length with the patient today.  Concerns regarding medicines are outlined above.  No orders of the defined types were placed in this encounter.  No orders of the defined types were placed in this encounter.    Chief Complaint  Patient presents with  . Follow-up     History of Present Illness:    Paige Walters is a 74 y.o. female. Patient has past medical history of coronary artery disease, essential hypertension and dyslipidemia. She denies any problems at this time and takes care of activities of daily living. No chest pain orthopnea or PND. At the time of my evaluation, the patient is alert awake oriented and in no distress. She walks on a regular basis.  Past Medical History:  Diagnosis Date  . CAD (coronary artery disease)   . Coronary artery  disease 08/26/2017  . GERD (gastroesophageal reflux disease)   . Hyperlipidemia   . Hypertension   . Overactive bladder   . Vitamin D deficiency     Past Surgical History:  Procedure Laterality Date  . CORONARY ARTERY BYPASS GRAFT      Current Medications: Current Meds  Medication Sig  . aspirin EC 81 MG tablet Take 162 mg by mouth daily.  . Coenzyme Q-10 200 MG CAPS Take 300 mg by mouth daily.  Marland Kitchen glucosamine-chondroitin 500-400 MG tablet Take 1 tablet by mouth daily.  Marland Kitchen losartan-hydrochlorothiazide (HYZAAR) 100-12.5 MG tablet   . Multiple Vitamin (MULTIVITAMIN) tablet Take 1 tablet by mouth daily.  Marland Kitchen omeprazole (PRILOSEC) 40 MG capsule Take 40 mg by mouth daily.  . rosuvastatin (CRESTOR) 20 MG tablet Take 20 mg by mouth daily.  . sertraline (ZOLOFT) 50 MG tablet Take 50 mg by mouth as needed.   . Vitamin D, Ergocalciferol, (DRISDOL) 50000 units CAPS capsule Take 5,000 Units by mouth daily.     Allergies:   Sulfa antibiotics   Social History   Socioeconomic History  . Marital status: Married    Spouse name: Not on file  . Number of children: Not on file  . Years of education: Not on file  . Highest education level: Not on file  Occupational History  . Not on file  Tobacco Use  . Smoking status: Never Smoker  . Smokeless tobacco: Never Used  Vaping Use  . Vaping Use: Never used  Substance and Sexual Activity  .  Alcohol use: No  . Drug use: No  . Sexual activity: Not on file  Other Topics Concern  . Not on file  Social History Narrative  . Not on file   Social Determinants of Health   Financial Resource Strain:   . Difficulty of Paying Living Expenses: Not on file  Food Insecurity:   . Worried About Charity fundraiser in the Last Year: Not on file  . Ran Out of Food in the Last Year: Not on file  Transportation Needs:   . Lack of Transportation (Medical): Not on file  . Lack of Transportation (Non-Medical): Not on file  Physical Activity:   . Days of  Exercise per Week: Not on file  . Minutes of Exercise per Session: Not on file  Stress:   . Feeling of Stress : Not on file  Social Connections:   . Frequency of Communication with Friends and Family: Not on file  . Frequency of Social Gatherings with Friends and Family: Not on file  . Attends Religious Services: Not on file  . Active Member of Clubs or Organizations: Not on file  . Attends Archivist Meetings: Not on file  . Marital Status: Not on file     Family History: The patient's family history includes Arthritis in her mother; Diabetes in her brother; Heart disease in her brother and mother; Hypertension in her mother; Thyroid disease in her mother.  ROS:   Please see the history of present illness.    All other systems reviewed and are negative.  EKGs/Labs/Other Studies Reviewed:    The following studies were reviewed today: I discussed my findings with the patient at extensive length. EKG was unremarkable. Revealed sinus rhythm and nonspecific ST-T changes.   Recent Labs: No results found for requested labs within last 8760 hours.  Recent Lipid Panel No results found for: CHOL, TRIG, HDL, CHOLHDL, VLDL, LDLCALC, LDLDIRECT  Physical Exam:    VS:  BP 138/70 (BP Location: Left Arm, Patient Position: Sitting, Cuff Size: Normal)   Pulse 62   Ht 5\' 8"  (1.727 m)   Wt 193 lb (87.5 kg)   SpO2 97%   BMI 29.35 kg/m     Wt Readings from Last 3 Encounters:  08/18/20 193 lb (87.5 kg)  09/16/19 189 lb 3.2 oz (85.8 kg)  11/24/18 192 lb (87.1 kg)     GEN: Patient is in no acute distress HEENT: Normal NECK: No JVD; No carotid bruits LYMPHATICS: No lymphadenopathy CARDIAC: Hear sounds regular, 2/6 systolic murmur at the apex. RESPIRATORY:  Clear to auscultation without rales, wheezing or rhonchi  ABDOMEN: Soft, non-tender, non-distended MUSCULOSKELETAL:  No edema; No deformity  SKIN: Warm and dry NEUROLOGIC:  Alert and oriented x 3 PSYCHIATRIC:  Normal affect    Signed, Jenean Lindau, MD  08/18/2020 11:15 AM    [place]

## 2020-08-18 NOTE — Addendum Note (Signed)
Addended by: Gar Ponto on: 08/18/2020 04:01 PM   Modules accepted: Orders

## 2020-08-18 NOTE — Patient Instructions (Signed)
Medication Instructions:  Your physician has recommended you make the following change in your medication:  START: Nitroglycerin 0.4 mg take one tablet by mouth every 5 minutes up to three times as needed for chest pain.   *If you need a refill on your cardiac medications before your next appointment, please call your pharmacy*   Lab Work: None If you have labs (blood work) drawn today and your tests are completely normal, you will receive your results only by: MyChart Message (if you have MyChart) OR A paper copy in the mail If you have any lab test that is abnormal or we need to change your treatment, we will call you to review the results.   Testing/Procedures: None   Follow-Up: At [facility], you and your health needs are our priority.  As part of our continuing mission to provide you with exceptional heart care, we have created designated Provider Care Teams.  These Care Teams include your primary Cardiologist (physician) and Advanced Practice Providers (APPs -  Physician Assistants and Nurse Practitioners) who all work together to provide you with the care you need, when you need it.  We recommend signing up for the patient portal called "MyChart".  Sign up information is provided on this After Visit Summary.  MyChart is used to connect with patients for Virtual Visits (Telemedicine).  Patients are able to view lab/test results, encounter notes, upcoming appointments, etc.  Non-urgent messages can be sent to your provider as well.   To learn more about what you can do with MyChart, go to [email or web address].    Your next appointment:   6 month(s)  The format for your next appointment:   In Person  Provider:   Rajan Revankar, MD   Other Instructions   

## 2020-09-17 ENCOUNTER — Other Ambulatory Visit: Payer: Self-pay | Admitting: Cardiology

## 2021-01-18 DIAGNOSIS — J309 Allergic rhinitis, unspecified: Secondary | ICD-10-CM | POA: Diagnosis not present

## 2021-01-18 DIAGNOSIS — Z79899 Other long term (current) drug therapy: Secondary | ICD-10-CM | POA: Diagnosis not present

## 2021-01-18 DIAGNOSIS — N3281 Overactive bladder: Secondary | ICD-10-CM | POA: Diagnosis not present

## 2021-01-18 DIAGNOSIS — Z6828 Body mass index (BMI) 28.0-28.9, adult: Secondary | ICD-10-CM | POA: Diagnosis not present

## 2021-01-18 DIAGNOSIS — E782 Mixed hyperlipidemia: Secondary | ICD-10-CM | POA: Diagnosis not present

## 2021-01-18 DIAGNOSIS — K219 Gastro-esophageal reflux disease without esophagitis: Secondary | ICD-10-CM | POA: Diagnosis not present

## 2021-01-18 DIAGNOSIS — I2510 Atherosclerotic heart disease of native coronary artery without angina pectoris: Secondary | ICD-10-CM | POA: Diagnosis not present

## 2021-01-18 DIAGNOSIS — E559 Vitamin D deficiency, unspecified: Secondary | ICD-10-CM | POA: Diagnosis not present

## 2021-01-18 DIAGNOSIS — I10 Essential (primary) hypertension: Secondary | ICD-10-CM | POA: Diagnosis not present

## 2021-02-13 DIAGNOSIS — I10 Essential (primary) hypertension: Secondary | ICD-10-CM | POA: Insufficient documentation

## 2021-02-14 ENCOUNTER — Ambulatory Visit: Payer: Medicare HMO | Admitting: Cardiology

## 2021-02-14 ENCOUNTER — Encounter: Payer: Self-pay | Admitting: Cardiology

## 2021-02-14 ENCOUNTER — Other Ambulatory Visit: Payer: Self-pay

## 2021-02-14 VITALS — BP 136/72 | HR 68 | Ht 68.0 in | Wt 194.6 lb

## 2021-02-14 DIAGNOSIS — I10 Essential (primary) hypertension: Secondary | ICD-10-CM | POA: Diagnosis not present

## 2021-02-14 DIAGNOSIS — E782 Mixed hyperlipidemia: Secondary | ICD-10-CM | POA: Diagnosis not present

## 2021-02-14 DIAGNOSIS — I2510 Atherosclerotic heart disease of native coronary artery without angina pectoris: Secondary | ICD-10-CM

## 2021-02-14 NOTE — Progress Notes (Signed)
Cardiology Office Note:    Date:  02/14/2021   ID:  Paige Walters, DOB 1946-02-10, MRN [id number]  PCP:  Nicholos Johns, MD  Cardiologist:  Jenean Lindau, MD   Referring MD: Nicholos Johns, MD    ASSESSMENT:    1. Coronary artery disease involving native coronary artery of native heart without angina pectoris   2. Mixed hyperlipidemia   3. Essential hypertension    PLAN:    In order of problems listed above:  1. Coronary artery disease: Secondary prevention stressed with the patient.  Importance of compliance with diet medication stressed and she vocalized understanding.  She was advised to walk at least half an hour a day on a regular basis and she promises to do so. 2. Essential hypertension: Blood pressure stable and diet was emphasized.  Lifestyle modification urged. 3. Mixed dyslipidemia: She had blood work done a month ago at primary care.  I will review it.  We will get a copy of those records.  Exercise and weight reduction stressed and she promises to do better. 4. Patient will be seen in follow-up appointment in 6 months or earlier if the patient has any concerns    Medication Adjustments/Labs and Tests Ordered: Current medicines are reviewed at length with the patient today.  Concerns regarding medicines are outlined above.  No orders of the defined types were placed in this encounter.  No orders of the defined types were placed in this encounter.    No chief complaint on file.    History of Present Illness:    Paige Walters is a 75 y.o. female.  Patient has past medical history of coronary artery disease, essential hypertension and dyslipidemia.  She denies any problems at this time and takes care of activities of daily living.  She is an active lady but does not exercise on a regular basis.  At the time of my evaluation, the patient is alert awake oriented and in no distress.  Past Medical History:  Diagnosis Date  . CAD (coronary artery disease)   . Coronary artery  disease 08/26/2017  . Essential hypertension 08/18/2020  . GERD (gastroesophageal reflux disease)   . Hyperlipidemia   . Hypertension   . Overactive bladder   . Vitamin D deficiency     Past Surgical History:  Procedure Laterality Date  . CORONARY ARTERY BYPASS GRAFT      Current Medications: Current Meds  Medication Sig  . losartan-hydrochlorothiazide (HYZAAR) 100-12.5 MG tablet Take 1 tablet by mouth daily.  . nitroGLYCERIN (NITROSTAT) 0.4 MG SL tablet Place 0.4 mg under the tongue every 5 (five) minutes as needed for chest pain.  Marland Kitchen sertraline (ZOLOFT) 50 MG tablet Take 50 mg by mouth as needed for anxiety.     Allergies:   Sulfa antibiotics   Social History   Socioeconomic History  . Marital status: Married    Spouse name: Not on file  . Number of children: Not on file  . Years of education: Not on file  . Highest education level: Not on file  Occupational History  . Not on file  Tobacco Use  . Smoking status: Never Smoker  . Smokeless tobacco: Never Used  Vaping Use  . Vaping Use: Never used  Substance and Sexual Activity  . Alcohol use: No  . Drug use: No  . Sexual activity: Not on file  Other Topics Concern  . Not on file  Social History Narrative  . Not on file   Social Determinants of  Health   Financial Resource Strain: Not on file  Food Insecurity: Not on file  Transportation Needs: Not on file  Physical Activity: Not on file  Stress: Not on file  Social Connections: Not on file     Family History: The patient's family history includes Arthritis in her mother; Diabetes in her brother; Heart disease in her brother and mother; Hypertension in her mother; Thyroid disease in her mother.  ROS:   Please see the history of present illness.    All other systems reviewed and are negative.  EKGs/Labs/Other Studies Reviewed:    The following studies were reviewed today: I discussed my findings with the patient at length.   Recent Labs: No results  found for requested labs within last 8760 hours.  Recent Lipid Panel No results found for: CHOL, TRIG, HDL, CHOLHDL, VLDL, LDLCALC, LDLDIRECT  Physical Exam:    VS:  There were no vitals taken for this visit.    Wt Readings from Last 3 Encounters:  08/18/20 193 lb (87.5 kg)  09/16/19 189 lb 3.2 oz (85.8 kg)  11/24/18 192 lb (87.1 kg)     GEN: Patient is in no acute distress HEENT: Normal NECK: No JVD; No carotid bruits LYMPHATICS: No lymphadenopathy CARDIAC: Hear sounds regular, 2/6 systolic murmur at the apex. RESPIRATORY:  Clear to auscultation without rales, wheezing or rhonchi  ABDOMEN: Soft, non-tender, non-distended MUSCULOSKELETAL:  No edema; No deformity  SKIN: Warm and dry NEUROLOGIC:  Alert and oriented x 3 PSYCHIATRIC:  Normal affect   Signed, Jenean Lindau, MD  02/14/2021 8:48 AM    [place]

## 2021-02-14 NOTE — Patient Instructions (Signed)

## 2021-04-19 DIAGNOSIS — Z139 Encounter for screening, unspecified: Secondary | ICD-10-CM | POA: Diagnosis not present

## 2021-04-19 DIAGNOSIS — N3281 Overactive bladder: Secondary | ICD-10-CM | POA: Diagnosis not present

## 2021-04-19 DIAGNOSIS — E782 Mixed hyperlipidemia: Secondary | ICD-10-CM | POA: Diagnosis not present

## 2021-04-19 DIAGNOSIS — I10 Essential (primary) hypertension: Secondary | ICD-10-CM | POA: Diagnosis not present

## 2021-04-19 DIAGNOSIS — Z9181 History of falling: Secondary | ICD-10-CM | POA: Diagnosis not present

## 2021-04-19 DIAGNOSIS — E559 Vitamin D deficiency, unspecified: Secondary | ICD-10-CM | POA: Diagnosis not present

## 2021-04-19 DIAGNOSIS — J309 Allergic rhinitis, unspecified: Secondary | ICD-10-CM | POA: Diagnosis not present

## 2021-04-19 DIAGNOSIS — Z1331 Encounter for screening for depression: Secondary | ICD-10-CM | POA: Diagnosis not present

## 2021-04-19 DIAGNOSIS — K219 Gastro-esophageal reflux disease without esophagitis: Secondary | ICD-10-CM | POA: Diagnosis not present

## 2021-04-19 DIAGNOSIS — I2510 Atherosclerotic heart disease of native coronary artery without angina pectoris: Secondary | ICD-10-CM | POA: Diagnosis not present

## 2021-06-11 DIAGNOSIS — H43819 Vitreous degeneration, unspecified eye: Secondary | ICD-10-CM | POA: Diagnosis not present

## 2021-06-11 DIAGNOSIS — H5213 Myopia, bilateral: Secondary | ICD-10-CM | POA: Diagnosis not present

## 2021-06-11 DIAGNOSIS — Z961 Presence of intraocular lens: Secondary | ICD-10-CM | POA: Diagnosis not present

## 2021-08-21 DIAGNOSIS — L720 Epidermal cyst: Secondary | ICD-10-CM | POA: Diagnosis not present

## 2021-08-21 DIAGNOSIS — D485 Neoplasm of uncertain behavior of skin: Secondary | ICD-10-CM | POA: Diagnosis not present

## 2021-09-18 DIAGNOSIS — E559 Vitamin D deficiency, unspecified: Secondary | ICD-10-CM | POA: Diagnosis not present

## 2021-09-18 DIAGNOSIS — I2510 Atherosclerotic heart disease of native coronary artery without angina pectoris: Secondary | ICD-10-CM | POA: Diagnosis not present

## 2021-09-18 DIAGNOSIS — Z79899 Other long term (current) drug therapy: Secondary | ICD-10-CM | POA: Diagnosis not present

## 2021-09-18 DIAGNOSIS — Z23 Encounter for immunization: Secondary | ICD-10-CM | POA: Diagnosis not present

## 2021-09-18 DIAGNOSIS — J309 Allergic rhinitis, unspecified: Secondary | ICD-10-CM | POA: Diagnosis not present

## 2021-09-18 DIAGNOSIS — I10 Essential (primary) hypertension: Secondary | ICD-10-CM | POA: Diagnosis not present

## 2021-09-18 DIAGNOSIS — Z6828 Body mass index (BMI) 28.0-28.9, adult: Secondary | ICD-10-CM | POA: Diagnosis not present

## 2021-09-18 DIAGNOSIS — K219 Gastro-esophageal reflux disease without esophagitis: Secondary | ICD-10-CM | POA: Diagnosis not present

## 2021-09-18 DIAGNOSIS — E782 Mixed hyperlipidemia: Secondary | ICD-10-CM | POA: Diagnosis not present

## 2021-09-18 DIAGNOSIS — N3281 Overactive bladder: Secondary | ICD-10-CM | POA: Diagnosis not present

## 2021-09-19 ENCOUNTER — Other Ambulatory Visit: Payer: Self-pay

## 2021-09-19 ENCOUNTER — Encounter: Payer: Self-pay | Admitting: Cardiology

## 2021-09-19 ENCOUNTER — Ambulatory Visit: Payer: Medicare HMO | Admitting: Cardiology

## 2021-09-19 VITALS — BP 158/78 | HR 62 | Ht 68.0 in | Wt 194.2 lb

## 2021-09-19 DIAGNOSIS — I10 Essential (primary) hypertension: Secondary | ICD-10-CM | POA: Diagnosis not present

## 2021-09-19 DIAGNOSIS — E782 Mixed hyperlipidemia: Secondary | ICD-10-CM | POA: Diagnosis not present

## 2021-09-19 DIAGNOSIS — I2510 Atherosclerotic heart disease of native coronary artery without angina pectoris: Secondary | ICD-10-CM | POA: Diagnosis not present

## 2021-09-19 NOTE — Progress Notes (Signed)
Cardiology Office Note:    Date:  09/19/2021   ID:  Paige Walters, DOB 07-13-1946, MRN [id number]  PCP:  Nicholos Johns, MD  Cardiologist:  Jenean Lindau, MD   Referring MD: Nicholos Johns, MD    ASSESSMENT:    1. Coronary artery disease involving native coronary artery of native heart without angina pectoris   2. Mixed hyperlipidemia   3. Essential hypertension    PLAN:    In order of problems listed above:  Coronary artery disease: Secondary prevention stressed with the patient.  Importance of compliance with diet medication stressed and she vocalized understanding.  She was advised to walk at least half an hour a day 5 days a week and she promises to do so. Essential hypertension: Blood pressure stable and diet was emphasized.  Lifestyle modification urged. Mixed dyslipidemia: Lipids reviewed.  I encouraged her to do better with diet.  She had blood work yesterday with primary care and will get a copy of it. Her blood pressure is elevated today.  She tells me that her and her husband check blood pressures on a regular basis and they are fine and she will present to me.  Both the blood works in the next few days and also send a copy of the blood pressure checks at home. Patient will be seen in follow-up appointment in 6 months or earlier if the patient has any concerns    Medication Adjustments/Labs and Tests Ordered: Current medicines are reviewed at length with the patient today.  Concerns regarding medicines are outlined above.  No orders of the defined types were placed in this encounter.  No orders of the defined types were placed in this encounter.    No chief complaint on file.    History of Present Illness:    Paige Walters is a 75 y.o. female.  Patient has past medical history of coronary artery disease, essential hypertension and dyslipidemia.  She denies any problems at this time and takes care of activities of daily living.  No chest pain orthopnea or PND.  At the time  of my evaluation, the patient is alert awake oriented and in no distress.  Past Medical History:  Diagnosis Date   CAD (coronary artery disease)    Coronary artery disease 08/26/2017   Essential hypertension 08/18/2020   GERD (gastroesophageal reflux disease)    Hyperlipidemia    Hypertension    Overactive bladder    Vitamin D deficiency     Past Surgical History:  Procedure Laterality Date   CORONARY ARTERY BYPASS GRAFT      Current Medications: Current Meds  Medication Sig   aspirin EC 81 MG tablet Take 162 mg by mouth daily.   Cholecalciferol (DIALYVITE VITAMIN D 5000) 125 MCG (5000 UT) capsule Take 5,000 Units by mouth daily.   Coenzyme Q-10 200 MG CAPS Take 300 mg by mouth daily.   glucosamine-chondroitin 500-400 MG tablet Take 1 tablet by mouth daily.   losartan-hydrochlorothiazide (HYZAAR) 100-12.5 MG tablet Take 1 tablet by mouth daily.   Multiple Vitamin (MULTIVITAMIN) tablet Take 1 tablet by mouth daily.   nitroGLYCERIN (NITROSTAT) 0.4 MG SL tablet Place 0.4 mg under the tongue every 5 (five) minutes as needed for chest pain.   omeprazole (PRILOSEC) 40 MG capsule Take 40 mg by mouth daily.   rosuvastatin (CRESTOR) 20 MG tablet Take 20 mg by mouth daily.   sertraline (ZOLOFT) 50 MG tablet Take 50 mg by mouth as needed for anxiety.  Allergies:   Sulfa antibiotics   Social History   Socioeconomic History   Marital status: Married    Spouse name: Not on file   Number of children: Not on file   Years of education: Not on file   Highest education level: Not on file  Occupational History   Not on file  Tobacco Use   Smoking status: Never   Smokeless tobacco: Never  Vaping Use   Vaping Use: Never used  Substance and Sexual Activity   Alcohol use: No   Drug use: No   Sexual activity: Not on file  Other Topics Concern   Not on file  Social History Narrative   Not on file   Social Determinants of Health   Financial Resource Strain: Not on file  Food  Insecurity: Not on file  Transportation Needs: Not on file  Physical Activity: Not on file  Stress: Not on file  Social Connections: Not on file     Family History: The patient's family history includes Arthritis in her mother; Diabetes in her brother; Heart disease in her brother and mother; Hypertension in her mother; Thyroid disease in her mother.  ROS:   Please see the history of present illness.    All other systems reviewed and are negative.  EKGs/Labs/Other Studies Reviewed:    The following studies were reviewed today: EKG reveals sinus rhythm and nonspecific ST-T changes   Recent Labs: No results found for requested labs within last 8760 hours.  Recent Lipid Panel No results found for: CHOL, TRIG, HDL, CHOLHDL, VLDL, LDLCALC, LDLDIRECT  Physical Exam:    VS:  BP (!) 158/78   Pulse 62   Ht 5\' 8"  (1.727 m)   Wt 194 lb 3.2 oz (88.1 kg)   SpO2 97%   BMI 29.53 kg/m     Wt Readings from Last 3 Encounters:  09/19/21 194 lb 3.2 oz (88.1 kg)  02/14/21 194 lb 9.6 oz (88.3 kg)  08/18/20 193 lb (87.5 kg)     GEN: Patient is in no acute distress HEENT: Normal NECK: No JVD; No carotid bruits LYMPHATICS: No lymphadenopathy CARDIAC: Hear sounds regular, 2/6 systolic murmur at the apex. RESPIRATORY:  Clear to auscultation without rales, wheezing or rhonchi  ABDOMEN: Soft, non-tender, non-distended MUSCULOSKELETAL:  No edema; No deformity  SKIN: Warm and dry NEUROLOGIC:  Alert and oriented x 3 PSYCHIATRIC:  Normal affect   Signed, Jenean Lindau, MD  09/19/2021 2:50 PM    [facility]

## 2021-09-19 NOTE — Patient Instructions (Signed)

## 2021-09-20 DIAGNOSIS — H2513 Age-related nuclear cataract, bilateral: Secondary | ICD-10-CM | POA: Diagnosis not present

## 2022-01-03 DIAGNOSIS — E782 Mixed hyperlipidemia: Secondary | ICD-10-CM | POA: Diagnosis not present

## 2022-01-03 DIAGNOSIS — Z6829 Body mass index (BMI) 29.0-29.9, adult: Secondary | ICD-10-CM | POA: Diagnosis not present

## 2022-01-03 DIAGNOSIS — Z23 Encounter for immunization: Secondary | ICD-10-CM | POA: Diagnosis not present

## 2022-01-03 DIAGNOSIS — I2510 Atherosclerotic heart disease of native coronary artery without angina pectoris: Secondary | ICD-10-CM | POA: Diagnosis not present

## 2022-01-03 DIAGNOSIS — I10 Essential (primary) hypertension: Secondary | ICD-10-CM | POA: Diagnosis not present

## 2022-01-03 DIAGNOSIS — N3281 Overactive bladder: Secondary | ICD-10-CM | POA: Diagnosis not present

## 2022-01-03 DIAGNOSIS — E559 Vitamin D deficiency, unspecified: Secondary | ICD-10-CM | POA: Diagnosis not present

## 2022-01-03 DIAGNOSIS — Z6828 Body mass index (BMI) 28.0-28.9, adult: Secondary | ICD-10-CM | POA: Diagnosis not present

## 2022-01-03 DIAGNOSIS — J309 Allergic rhinitis, unspecified: Secondary | ICD-10-CM | POA: Diagnosis not present

## 2022-01-03 DIAGNOSIS — K219 Gastro-esophageal reflux disease without esophagitis: Secondary | ICD-10-CM | POA: Diagnosis not present

## 2022-01-29 DIAGNOSIS — H25813 Combined forms of age-related cataract, bilateral: Secondary | ICD-10-CM | POA: Diagnosis not present

## 2022-01-29 DIAGNOSIS — Z01818 Encounter for other preprocedural examination: Secondary | ICD-10-CM | POA: Diagnosis not present

## 2022-01-29 DIAGNOSIS — H25811 Combined forms of age-related cataract, right eye: Secondary | ICD-10-CM | POA: Diagnosis not present

## 2022-02-05 DIAGNOSIS — K219 Gastro-esophageal reflux disease without esophagitis: Secondary | ICD-10-CM | POA: Diagnosis not present

## 2022-02-05 DIAGNOSIS — H259 Unspecified age-related cataract: Secondary | ICD-10-CM | POA: Diagnosis not present

## 2022-02-05 DIAGNOSIS — I10 Essential (primary) hypertension: Secondary | ICD-10-CM | POA: Diagnosis not present

## 2022-02-05 DIAGNOSIS — Z7984 Long term (current) use of oral hypoglycemic drugs: Secondary | ICD-10-CM | POA: Diagnosis not present

## 2022-02-05 DIAGNOSIS — H25811 Combined forms of age-related cataract, right eye: Secondary | ICD-10-CM | POA: Diagnosis not present

## 2022-03-05 DIAGNOSIS — I10 Essential (primary) hypertension: Secondary | ICD-10-CM | POA: Diagnosis not present

## 2022-03-05 DIAGNOSIS — I2510 Atherosclerotic heart disease of native coronary artery without angina pectoris: Secondary | ICD-10-CM | POA: Diagnosis not present

## 2022-03-05 DIAGNOSIS — H259 Unspecified age-related cataract: Secondary | ICD-10-CM | POA: Diagnosis not present

## 2022-03-05 DIAGNOSIS — H25812 Combined forms of age-related cataract, left eye: Secondary | ICD-10-CM | POA: Diagnosis not present

## 2022-03-05 DIAGNOSIS — Z7982 Long term (current) use of aspirin: Secondary | ICD-10-CM | POA: Diagnosis not present

## 2022-03-20 ENCOUNTER — Ambulatory Visit: Payer: Medicare HMO | Admitting: Cardiology

## 2022-03-20 ENCOUNTER — Encounter: Payer: Self-pay | Admitting: Cardiology

## 2022-03-20 VITALS — BP 126/82 | HR 67 | Ht 68.0 in | Wt 183.8 lb

## 2022-03-20 DIAGNOSIS — I10 Essential (primary) hypertension: Secondary | ICD-10-CM

## 2022-03-20 DIAGNOSIS — E782 Mixed hyperlipidemia: Secondary | ICD-10-CM | POA: Diagnosis not present

## 2022-03-20 DIAGNOSIS — I2510 Atherosclerotic heart disease of native coronary artery without angina pectoris: Secondary | ICD-10-CM

## 2022-03-20 NOTE — Progress Notes (Signed)
?Cardiology Office Note:   ? ?Date:  03/20/2022  ? ?ID:  Paige Walters, DOB Mar 22, 1946, MRN [id number] ? ?PCP:  Nicholos Johns, MD  ?Cardiologist:  Jenean Lindau, MD  ? ?Referring MD: Nicholos Johns, MD  ? ? ?ASSESSMENT:   ? ?1. Coronary artery disease involving native coronary artery of native heart without angina pectoris   ?2. Mixed hyperlipidemia   ?3. Essential hypertension   ? ?PLAN:   ? ?In order of problems listed above: ? ?Coronary artery disease: Secondary prevention stressed with the patient.  Importance of compliance with diet medication stressed and she vocalized understanding.  She was advised to walk at least half an hour a day 5 days a week and she promises to do so. ?Essential hypertension: Blood pressure stable and diet was emphasized.  Lifestyle modification urged. ?Mixed dyslipidemia: On lipid-lowering therapy.  Lipids reviewed and discussed with the patient at length.  She will have blood work next week by primary care and forward me a copy. ?Patient will be seen in follow-up appointment in 12 months or earlier if the patient has any concerns ? ? ? ?Medication Adjustments/Labs and Tests Ordered: ?Current medicines are reviewed at length with the patient today.  Concerns regarding medicines are outlined above.  ?No orders of the defined types were placed in this encounter. ? ?No orders of the defined types were placed in this encounter. ? ? ? ?Chief Complaint  ?Patient presents with  ? Follow-up  ?  ? ?History of Present Illness:   ? ?Paige Walters is a 76 y.o. female.  Patient has past medical history of coronary artery disease, essential hypertension, mixed dyslipidemia.  She denies any problems at this time and takes care of activities of daily living.  No chest pain orthopnea or PND.  She walks on a regular basis.  At the time of my evaluation, the patient is alert awake oriented and in no distress. ? ?Past Medical History:  ?Diagnosis Date  ? CAD (coronary artery disease)   ? Coronary artery disease  08/26/2017  ? Essential hypertension 08/18/2020  ? GERD (gastroesophageal reflux disease)   ? Hyperlipidemia   ? Hypertension   ? Overactive bladder   ? Vitamin D deficiency   ? ? ?Past Surgical History:  ?Procedure Laterality Date  ? CORONARY ARTERY BYPASS GRAFT    ? ? ?Current Medications: ?Current Meds  ?Medication Sig  ? aspirin EC 81 MG tablet Take 162 mg by mouth daily.  ? Cholecalciferol (DIALYVITE VITAMIN D 5000) 125 MCG (5000 UT) capsule Take 5,000 Units by mouth daily.  ? Coenzyme Q-10 200 MG CAPS Take 300 mg by mouth daily.  ? glucosamine-chondroitin 500-400 MG tablet Take 1 tablet by mouth daily.  ? losartan-hydrochlorothiazide (HYZAAR) 100-12.5 MG tablet Take 1 tablet by mouth daily.  ? Multiple Vitamin (MULTIVITAMIN) tablet Take 1 tablet by mouth daily.  ? nitroGLYCERIN (NITROSTAT) 0.4 MG SL tablet Place 0.4 mg under the tongue every 5 (five) minutes as needed for chest pain.  ? omeprazole (PRILOSEC) 40 MG capsule Take 40 mg by mouth daily.  ? rosuvastatin (CRESTOR) 20 MG tablet Take 20 mg by mouth daily.  ? sertraline (ZOLOFT) 50 MG tablet Take 50 mg by mouth as needed for anxiety.  ?  ? ?Allergies:   Sulfa antibiotics  ? ?Social History  ? ?Socioeconomic History  ? Marital status: Widowed  ?  Spouse name: Not on file  ? Number of children: Not on file  ? Years of education: Not  on file  ? Highest education level: Not on file  ?Occupational History  ? Not on file  ?Tobacco Use  ? Smoking status: Never  ? Smokeless tobacco: Never  ?Vaping Use  ? Vaping Use: Never used  ?Substance and Sexual Activity  ? Alcohol use: No  ? Drug use: No  ? Sexual activity: Not on file  ?Other Topics Concern  ? Not on file  ?Social History Narrative  ? Not on file  ? ?Social Determinants of Health  ? ?Financial Resource Strain: Not on file  ?Food Insecurity: Not on file  ?Transportation Needs: Not on file  ?Physical Activity: Not on file  ?Stress: Not on file  ?Social Connections: Not on file  ?  ? ?Family History: ?The  patient's family history includes Arthritis in her mother; Diabetes in her brother; Heart disease in her brother and mother; Hypertension in her mother; Thyroid disease in her mother. ? ?ROS:   ?Please see the history of present illness.    ?All other systems reviewed and are negative. ? ?EKGs/Labs/Other Studies Reviewed:   ? ?The following studies were reviewed today: ?I discussed my findings with the patient at length ? ? ?Recent Labs: ?No results found for requested labs within last 8760 hours.  ?Recent Lipid Panel ?No results found for: CHOL, TRIG, HDL, CHOLHDL, VLDL, LDLCALC, LDLDIRECT ? ?Physical Exam:   ? ?VS:  BP 126/82 (BP Location: Left Arm, Patient Position: Sitting)   Pulse 67   Ht '5\' 8"'$  (1.727 m)   Wt 183 lb 12.8 oz (83.4 kg)   SpO2 93%   BMI 27.95 kg/m?    ? ?Wt Readings from Last 3 Encounters:  ?03/20/22 183 lb 12.8 oz (83.4 kg)  ?09/19/21 194 lb 3.2 oz (88.1 kg)  ?02/14/21 194 lb 9.6 oz (88.3 kg)  ?  ? ?GEN: Patient is in no acute distress ?HEENT: Normal ?NECK: No JVD; No carotid bruits ?LYMPHATICS: No lymphadenopathy ?CARDIAC: Hear sounds regular, 2/6 systolic murmur at the apex. ?RESPIRATORY:  Clear to auscultation without rales, wheezing or rhonchi  ?ABDOMEN: Soft, non-tender, non-distended ?MUSCULOSKELETAL:  No edema; No deformity  ?SKIN: Warm and dry ?NEUROLOGIC:  Alert and oriented x 3 ?PSYCHIATRIC:  Normal affect  ? ?Signed, ?Jenean Lindau, MD  ?03/20/2022 12:51 PM    ?[facility]  ?

## 2022-03-20 NOTE — Patient Instructions (Signed)

## 2022-04-03 DIAGNOSIS — Z961 Presence of intraocular lens: Secondary | ICD-10-CM | POA: Diagnosis not present

## 2022-04-03 DIAGNOSIS — Z0100 Encounter for examination of eyes and vision without abnormal findings: Secondary | ICD-10-CM | POA: Diagnosis not present

## 2022-04-03 DIAGNOSIS — H524 Presbyopia: Secondary | ICD-10-CM | POA: Diagnosis not present

## 2022-04-16 DIAGNOSIS — N3281 Overactive bladder: Secondary | ICD-10-CM | POA: Diagnosis not present

## 2022-04-16 DIAGNOSIS — I10 Essential (primary) hypertension: Secondary | ICD-10-CM | POA: Diagnosis not present

## 2022-04-16 DIAGNOSIS — E559 Vitamin D deficiency, unspecified: Secondary | ICD-10-CM | POA: Diagnosis not present

## 2022-04-16 DIAGNOSIS — Z79899 Other long term (current) drug therapy: Secondary | ICD-10-CM | POA: Diagnosis not present

## 2022-04-16 DIAGNOSIS — J309 Allergic rhinitis, unspecified: Secondary | ICD-10-CM | POA: Diagnosis not present

## 2022-04-16 DIAGNOSIS — I2510 Atherosclerotic heart disease of native coronary artery without angina pectoris: Secondary | ICD-10-CM | POA: Diagnosis not present

## 2022-04-16 DIAGNOSIS — Z6829 Body mass index (BMI) 29.0-29.9, adult: Secondary | ICD-10-CM | POA: Diagnosis not present

## 2022-04-16 DIAGNOSIS — M549 Dorsalgia, unspecified: Secondary | ICD-10-CM | POA: Diagnosis not present

## 2022-04-16 DIAGNOSIS — R7303 Prediabetes: Secondary | ICD-10-CM | POA: Diagnosis not present

## 2022-04-16 DIAGNOSIS — K219 Gastro-esophageal reflux disease without esophagitis: Secondary | ICD-10-CM | POA: Diagnosis not present

## 2022-04-16 DIAGNOSIS — E782 Mixed hyperlipidemia: Secondary | ICD-10-CM | POA: Diagnosis not present

## 2022-07-16 DIAGNOSIS — G8929 Other chronic pain: Secondary | ICD-10-CM | POA: Diagnosis not present

## 2022-07-16 DIAGNOSIS — N3281 Overactive bladder: Secondary | ICD-10-CM | POA: Diagnosis not present

## 2022-07-16 DIAGNOSIS — M549 Dorsalgia, unspecified: Secondary | ICD-10-CM | POA: Diagnosis not present

## 2022-07-16 DIAGNOSIS — I2510 Atherosclerotic heart disease of native coronary artery without angina pectoris: Secondary | ICD-10-CM | POA: Diagnosis not present

## 2022-07-16 DIAGNOSIS — Z6828 Body mass index (BMI) 28.0-28.9, adult: Secondary | ICD-10-CM | POA: Diagnosis not present

## 2022-07-16 DIAGNOSIS — Z139 Encounter for screening, unspecified: Secondary | ICD-10-CM | POA: Diagnosis not present

## 2022-07-16 DIAGNOSIS — K219 Gastro-esophageal reflux disease without esophagitis: Secondary | ICD-10-CM | POA: Diagnosis not present

## 2022-07-16 DIAGNOSIS — Z9181 History of falling: Secondary | ICD-10-CM | POA: Diagnosis not present

## 2022-07-16 DIAGNOSIS — R69 Illness, unspecified: Secondary | ICD-10-CM | POA: Diagnosis not present

## 2022-07-16 DIAGNOSIS — I10 Essential (primary) hypertension: Secondary | ICD-10-CM | POA: Diagnosis not present

## 2022-11-21 DIAGNOSIS — M549 Dorsalgia, unspecified: Secondary | ICD-10-CM | POA: Diagnosis not present

## 2022-11-21 DIAGNOSIS — E559 Vitamin D deficiency, unspecified: Secondary | ICD-10-CM | POA: Diagnosis not present

## 2022-11-21 DIAGNOSIS — I2510 Atherosclerotic heart disease of native coronary artery without angina pectoris: Secondary | ICD-10-CM | POA: Diagnosis not present

## 2022-11-21 DIAGNOSIS — Z23 Encounter for immunization: Secondary | ICD-10-CM | POA: Diagnosis not present

## 2022-11-21 DIAGNOSIS — E782 Mixed hyperlipidemia: Secondary | ICD-10-CM | POA: Diagnosis not present

## 2022-11-21 DIAGNOSIS — K219 Gastro-esophageal reflux disease without esophagitis: Secondary | ICD-10-CM | POA: Diagnosis not present

## 2022-11-21 DIAGNOSIS — Z6828 Body mass index (BMI) 28.0-28.9, adult: Secondary | ICD-10-CM | POA: Diagnosis not present

## 2022-11-21 DIAGNOSIS — Z79899 Other long term (current) drug therapy: Secondary | ICD-10-CM | POA: Diagnosis not present

## 2022-11-21 DIAGNOSIS — R69 Illness, unspecified: Secondary | ICD-10-CM | POA: Diagnosis not present

## 2022-11-21 DIAGNOSIS — J309 Allergic rhinitis, unspecified: Secondary | ICD-10-CM | POA: Diagnosis not present

## 2022-11-21 DIAGNOSIS — N3281 Overactive bladder: Secondary | ICD-10-CM | POA: Diagnosis not present

## 2022-11-21 DIAGNOSIS — I10 Essential (primary) hypertension: Secondary | ICD-10-CM | POA: Diagnosis not present

## 2023-02-20 DIAGNOSIS — Z6827 Body mass index (BMI) 27.0-27.9, adult: Secondary | ICD-10-CM | POA: Diagnosis not present

## 2023-02-20 DIAGNOSIS — N3281 Overactive bladder: Secondary | ICD-10-CM | POA: Diagnosis not present

## 2023-02-20 DIAGNOSIS — I10 Essential (primary) hypertension: Secondary | ICD-10-CM | POA: Diagnosis not present

## 2023-02-20 DIAGNOSIS — E559 Vitamin D deficiency, unspecified: Secondary | ICD-10-CM | POA: Diagnosis not present

## 2023-02-20 DIAGNOSIS — K219 Gastro-esophageal reflux disease without esophagitis: Secondary | ICD-10-CM | POA: Diagnosis not present

## 2023-02-20 DIAGNOSIS — E782 Mixed hyperlipidemia: Secondary | ICD-10-CM | POA: Diagnosis not present

## 2023-02-20 DIAGNOSIS — R7303 Prediabetes: Secondary | ICD-10-CM | POA: Diagnosis not present

## 2023-02-20 DIAGNOSIS — R69 Illness, unspecified: Secondary | ICD-10-CM | POA: Diagnosis not present

## 2023-02-20 DIAGNOSIS — J309 Allergic rhinitis, unspecified: Secondary | ICD-10-CM | POA: Diagnosis not present

## 2023-02-20 DIAGNOSIS — I2510 Atherosclerotic heart disease of native coronary artery without angina pectoris: Secondary | ICD-10-CM | POA: Diagnosis not present

## 2023-04-09 DIAGNOSIS — H524 Presbyopia: Secondary | ICD-10-CM | POA: Diagnosis not present

## 2023-05-05 ENCOUNTER — Encounter: Payer: Self-pay | Admitting: Cardiology

## 2023-05-05 ENCOUNTER — Ambulatory Visit: Payer: Medicare HMO | Attending: Cardiology | Admitting: Cardiology

## 2023-05-05 VITALS — BP 118/70 | HR 67 | Ht 68.0 in | Wt 168.0 lb

## 2023-05-05 DIAGNOSIS — I10 Essential (primary) hypertension: Secondary | ICD-10-CM | POA: Diagnosis not present

## 2023-05-05 DIAGNOSIS — E781 Pure hyperglyceridemia: Secondary | ICD-10-CM | POA: Diagnosis not present

## 2023-05-05 DIAGNOSIS — I2510 Atherosclerotic heart disease of native coronary artery without angina pectoris: Secondary | ICD-10-CM

## 2023-05-05 NOTE — Patient Instructions (Signed)
Medication Instructions:  Your physician recommends that you continue on your current medications as directed. Please refer to the Current Medication list given to you today.  *If you need a refill on your cardiac medications before your next appointment, please call your pharmacy*   Lab Work: None ordered If you have labs (blood work) drawn today and your tests are completely normal, you will receive your results only by: MyChart Message (if you have MyChart) OR A paper copy in the mail If you have any lab test that is abnormal or we need to change your treatment, we will call you to review the results.   Testing/Procedures: None ordered   Follow-Up: At [facility], you and your health needs are our priority.  As part of our continuing mission to provide you with exceptional heart care, we have created designated Provider Care Teams.  These Care Teams include your primary Cardiologist (physician) and Advanced Practice Providers (APPs -  Physician Assistants and Nurse Practitioners) who all work together to provide you with the care you need, when you need it.  We recommend signing up for the patient portal called "MyChart".  Sign up information is provided on this After Visit Summary.  MyChart is used to connect with patients for Virtual Visits (Telemedicine).  Patients are able to view lab/test results, encounter notes, upcoming appointments, etc.  Non-urgent messages can be sent to your provider as well.   To learn more about what you can do with MyChart, go to [email or web address].com.    Your next appointment:   12 month(s)  The format for your next appointment:   In Person  Provider:   Rajan Revankar, MD    Other Instructions none  Important Information About Sugar      

## 2023-05-05 NOTE — Progress Notes (Signed)
Cardiology Office Note:    Date:  05/05/2023   ID:  Paige Walters, DOB 11/01/1946, MRN [id number]  PCP:  Lucianne Lei, MD  Cardiologist:  Garwin Brothers, MD   Referring MD: Lucianne Lei, MD    ASSESSMENT:    1. Coronary artery disease involving native coronary artery of native heart without angina pectoris   2. Essential hypertension   3. Pure hypertriglyceridemia    PLAN:    In order of problems listed above:  Coronary artery disease: Secondary prevention stressed.  Importance of compliance with diet and medication stressed and she vocalized understanding.  Questions were answered to her satisfaction.  She has been advised to walk at least half an hour a day 5 days a week Essential hypertension: Blood pressure is stable.  She mentions to me that her blood pressure was dropping low so she is not taking losartan hydrochlorothiazide.  I agree with her decision.  She is excellent. Mixed dyslipidemia: On lipid-lowering medications followed by primary I reviewed lipids.  They are fine.  Hemoglobin A1c issues were discussed. Patient will be seen in follow-up appointment in 6 months or earlier if the patient has any concerns.    Medication Adjustments/Labs and Tests Ordered: Current medicines are reviewed at length with the patient today.  Concerns regarding medicines are outlined above.  Orders Placed This Encounter  Procedures   EKG 12-Lead   No orders of the defined types were placed in this encounter.    No chief complaint on file.    History of Present Illness:    Paige Walters is a 77 y.o. female.  Patient has past medical history of coronary artery disease, essential hypertension, mixed dyslipidemia and A1c.  She denies any problems at this time and takes care of activities of daily living.  She is dieting well and has lost significant amount of weight and is happy about it.  At the time of my evaluation, the patient is alert awake oriented and in no distress.  Past Medical  History:  Diagnosis Date   CAD (coronary artery disease)    Coronary artery disease 08/26/2017   Essential hypertension 08/18/2020   GERD (gastroesophageal reflux disease)    Hyperlipidemia    Hypertension    Overactive bladder    Vitamin D deficiency     Past Surgical History:  Procedure Laterality Date   CORONARY ARTERY BYPASS GRAFT      Current Medications: Current Meds  Medication Sig   aspirin EC 81 MG tablet Take 162 mg by mouth daily.   Cholecalciferol (DIALYVITE VITAMIN D 5000) 125 MCG (5000 UT) capsule Take 5,000 Units by mouth daily.   Coenzyme Q-10 200 MG CAPS Take 300 mg by mouth daily.   glucosamine-chondroitin 500-400 MG tablet Take 1 tablet by mouth daily.   Multiple Vitamin (MULTIVITAMIN) tablet Take 1 tablet by mouth daily.   nitroGLYCERIN (NITROSTAT) 0.4 MG SL tablet Place 0.4 mg under the tongue every 5 (five) minutes as needed for chest pain.   omeprazole (PRILOSEC) 40 MG capsule Take 40 mg by mouth daily.   rosuvastatin (CRESTOR) 20 MG tablet Take 20 mg by mouth daily.   sertraline (ZOLOFT) 50 MG tablet Take 50 mg by mouth as needed for anxiety.     Allergies:   Sulfa antibiotics   Social History   Socioeconomic History   Marital status: Widowed    Spouse name: Not on file   Number of children: Not on file   Years of education: Not on  file   Highest education level: Not on file  Occupational History   Not on file  Tobacco Use   Smoking status: Never   Smokeless tobacco: Never  Vaping Use   Vaping Use: Never used  Substance and Sexual Activity   Alcohol use: No   Drug use: No   Sexual activity: Not on file  Other Topics Concern   Not on file  Social History Narrative   Not on file   Social Determinants of Health   Financial Resource Strain: Not on file  Food Insecurity: Not on file  Transportation Needs: Not on file  Physical Activity: Not on file  Stress: Not on file  Social Connections: Not on file     Family History: The  patient's family history includes Arthritis in her mother; Diabetes in her brother; Heart disease in her brother and mother; Hypertension in her mother; Thyroid disease in her mother.  ROS:   Please see the history of present illness.    All other systems reviewed and are negative.  EKGs/Labs/Other Studies Reviewed:    The following studies were reviewed today: I discussed my findings with the patient at length   Recent Labs: No results found for requested labs within last 365 days.  Recent Lipid Panel No results found for: "CHOL", "TRIG", "HDL", "CHOLHDL", "VLDL", "LDLCALC", "LDLDIRECT"  Physical Exam:    VS:  BP 118/70   Pulse 67   Ht 5\' 8"  (1.727 m)   Wt 168 lb (76.2 kg)   SpO2 95%   BMI 25.54 kg/m     Wt Readings from Last 3 Encounters:  05/05/23 168 lb (76.2 kg)  03/20/22 183 lb 12.8 oz (83.4 kg)  09/19/21 194 lb 3.2 oz (88.1 kg)     GEN: Patient is in no acute distress HEENT: Normal NECK: No JVD; No carotid bruits LYMPHATICS: No lymphadenopathy CARDIAC: Hear sounds regular, 2/6 systolic murmur at the apex. RESPIRATORY:  Clear to auscultation without rales, wheezing or rhonchi  ABDOMEN: Soft, non-tender, non-distended MUSCULOSKELETAL:  No edema; No deformity  SKIN: Warm and dry NEUROLOGIC:  Alert and oriented x 3 PSYCHIATRIC:  Normal affect   Signed, Garwin Brothers, MD  05/05/2023 3:25 PM    [facility]

## 2023-06-12 DIAGNOSIS — R7303 Prediabetes: Secondary | ICD-10-CM | POA: Diagnosis not present

## 2023-06-12 DIAGNOSIS — I10 Essential (primary) hypertension: Secondary | ICD-10-CM | POA: Diagnosis not present

## 2023-06-12 DIAGNOSIS — E782 Mixed hyperlipidemia: Secondary | ICD-10-CM | POA: Diagnosis not present

## 2023-06-12 DIAGNOSIS — M549 Dorsalgia, unspecified: Secondary | ICD-10-CM | POA: Diagnosis not present

## 2023-06-12 DIAGNOSIS — F419 Anxiety disorder, unspecified: Secondary | ICD-10-CM | POA: Diagnosis not present

## 2023-06-12 DIAGNOSIS — G8929 Other chronic pain: Secondary | ICD-10-CM | POA: Diagnosis not present

## 2023-06-12 DIAGNOSIS — J309 Allergic rhinitis, unspecified: Secondary | ICD-10-CM | POA: Diagnosis not present

## 2023-06-12 DIAGNOSIS — E559 Vitamin D deficiency, unspecified: Secondary | ICD-10-CM | POA: Diagnosis not present

## 2023-06-12 DIAGNOSIS — I2510 Atherosclerotic heart disease of native coronary artery without angina pectoris: Secondary | ICD-10-CM | POA: Diagnosis not present

## 2023-06-12 DIAGNOSIS — N3281 Overactive bladder: Secondary | ICD-10-CM | POA: Diagnosis not present

## 2023-06-12 DIAGNOSIS — K219 Gastro-esophageal reflux disease without esophagitis: Secondary | ICD-10-CM | POA: Diagnosis not present

## 2023-06-12 DIAGNOSIS — Z6825 Body mass index (BMI) 25.0-25.9, adult: Secondary | ICD-10-CM | POA: Diagnosis not present

## 2023-06-12 DIAGNOSIS — F330 Major depressive disorder, recurrent, mild: Secondary | ICD-10-CM | POA: Diagnosis not present

## 2023-06-12 DIAGNOSIS — Z79899 Other long term (current) drug therapy: Secondary | ICD-10-CM | POA: Diagnosis not present

## 2023-08-26 DIAGNOSIS — M25522 Pain in left elbow: Secondary | ICD-10-CM | POA: Diagnosis not present

## 2023-09-09 DIAGNOSIS — M7022 Olecranon bursitis, left elbow: Secondary | ICD-10-CM | POA: Diagnosis not present

## 2023-09-18 DIAGNOSIS — J309 Allergic rhinitis, unspecified: Secondary | ICD-10-CM | POA: Diagnosis not present

## 2023-09-18 DIAGNOSIS — Z23 Encounter for immunization: Secondary | ICD-10-CM | POA: Diagnosis not present

## 2023-09-18 DIAGNOSIS — Z139 Encounter for screening, unspecified: Secondary | ICD-10-CM | POA: Diagnosis not present

## 2023-09-18 DIAGNOSIS — F330 Major depressive disorder, recurrent, mild: Secondary | ICD-10-CM | POA: Diagnosis not present

## 2023-09-18 DIAGNOSIS — N3281 Overactive bladder: Secondary | ICD-10-CM | POA: Diagnosis not present

## 2023-09-18 DIAGNOSIS — E782 Mixed hyperlipidemia: Secondary | ICD-10-CM | POA: Diagnosis not present

## 2023-09-18 DIAGNOSIS — K219 Gastro-esophageal reflux disease without esophagitis: Secondary | ICD-10-CM | POA: Diagnosis not present

## 2023-09-18 DIAGNOSIS — F419 Anxiety disorder, unspecified: Secondary | ICD-10-CM | POA: Diagnosis not present

## 2023-09-18 DIAGNOSIS — E559 Vitamin D deficiency, unspecified: Secondary | ICD-10-CM | POA: Diagnosis not present

## 2023-09-18 DIAGNOSIS — I2510 Atherosclerotic heart disease of native coronary artery without angina pectoris: Secondary | ICD-10-CM | POA: Diagnosis not present

## 2023-09-18 DIAGNOSIS — Z9181 History of falling: Secondary | ICD-10-CM | POA: Diagnosis not present

## 2023-09-18 DIAGNOSIS — I10 Essential (primary) hypertension: Secondary | ICD-10-CM | POA: Diagnosis not present

## 2024-02-12 DIAGNOSIS — J309 Allergic rhinitis, unspecified: Secondary | ICD-10-CM | POA: Diagnosis not present

## 2024-02-12 DIAGNOSIS — I10 Essential (primary) hypertension: Secondary | ICD-10-CM | POA: Diagnosis not present

## 2024-02-12 DIAGNOSIS — R7303 Prediabetes: Secondary | ICD-10-CM | POA: Diagnosis not present

## 2024-02-12 DIAGNOSIS — K219 Gastro-esophageal reflux disease without esophagitis: Secondary | ICD-10-CM | POA: Diagnosis not present

## 2024-02-12 DIAGNOSIS — N3281 Overactive bladder: Secondary | ICD-10-CM | POA: Diagnosis not present

## 2024-02-12 DIAGNOSIS — E782 Mixed hyperlipidemia: Secondary | ICD-10-CM | POA: Diagnosis not present

## 2024-02-12 DIAGNOSIS — E559 Vitamin D deficiency, unspecified: Secondary | ICD-10-CM | POA: Diagnosis not present

## 2024-02-12 DIAGNOSIS — Z8739 Personal history of other diseases of the musculoskeletal system and connective tissue: Secondary | ICD-10-CM | POA: Diagnosis not present

## 2024-02-12 DIAGNOSIS — Z79899 Other long term (current) drug therapy: Secondary | ICD-10-CM | POA: Diagnosis not present

## 2024-02-12 DIAGNOSIS — I2510 Atherosclerotic heart disease of native coronary artery without angina pectoris: Secondary | ICD-10-CM | POA: Diagnosis not present

## 2024-04-12 DIAGNOSIS — H04123 Dry eye syndrome of bilateral lacrimal glands: Secondary | ICD-10-CM | POA: Diagnosis not present

## 2024-05-05 ENCOUNTER — Encounter: Payer: Self-pay | Admitting: Cardiology

## 2024-05-05 ENCOUNTER — Ambulatory Visit: Attending: Cardiology | Admitting: Cardiology

## 2024-05-05 VITALS — BP 124/62 | HR 63 | Ht 68.0 in | Wt 178.2 lb

## 2024-05-05 DIAGNOSIS — E782 Mixed hyperlipidemia: Secondary | ICD-10-CM | POA: Diagnosis not present

## 2024-05-05 DIAGNOSIS — I2510 Atherosclerotic heart disease of native coronary artery without angina pectoris: Secondary | ICD-10-CM

## 2024-05-05 DIAGNOSIS — I10 Essential (primary) hypertension: Secondary | ICD-10-CM | POA: Diagnosis not present

## 2024-05-05 MED ORDER — NITROGLYCERIN 0.4 MG SL SUBL: 0.4000 mg | SUBLINGUAL_TABLET | SUBLINGUAL | 6 refills | Status: AC | PRN

## 2024-05-05 NOTE — Progress Notes (Signed)
 Cardiology Office Note:    Date:  05/05/2024   ID:  Paige Walters, DOB Jul 20, 1946, MRN [id number]  PCP:  Gable Cambric, MD  Cardiologist:  Jennifer JONELLE Crape, MD   Referring MD: Gable Cambric, MD    ASSESSMENT:    1. Coronary artery disease involving native coronary artery of native heart without angina pectoris   2. Essential hypertension   3. Mixed hyperlipidemia    PLAN:    In order of problems listed above:  Coronary artery disease: Secondary prevention stressed with the patient.  Importance of compliance with diet medication stressed and she vocalized understanding.  She was advised to ambulate about half an hour a day on a daily basis and she promises to do so.  Nitroglycerin  prescription was sent and she knows how to use it. Essential hypertension: Blood pressure is stable and diet was emphasized. Mixed dyslipidemia: On lipid-lowering medications followed by primary care.  Lipids reviewed from [facility] sheet and found to be fine. Patient will be seen in follow-up appointment in 6 months or earlier if the patient has any concerns.    Medication Adjustments/Labs and Tests Ordered: Current medicines are reviewed at length with the patient today.  Concerns regarding medicines are outlined above.  Orders Placed This Encounter  Procedures   EKG 12-Lead   Meds ordered this encounter  Medications   nitroGLYCERIN  (NITROSTAT ) 0.4 MG SL tablet    Sig: Place 1 tablet (0.4 mg total) under the tongue every 5 (five) minutes as needed for chest pain.    Dispense:  25 tablet    Refill:  6     No chief complaint on file.    History of Present Illness:    Paige Walters is a 78 y.o. female.  Patient has past medical history of coronary artery disease post CABG surgery, essential hypertension, mixed dyslipidemia.  She denies any problems at this time and takes care of activities of daily living.  She walks on a regular basis age appropriately.  With no symptoms.  At the time of my evaluation, the  patient is alert awake oriented and in no distress.  Past Medical History:  Diagnosis Date   CAD (coronary artery disease)    Coronary artery disease 08/26/2017   Essential hypertension 08/18/2020   GERD (gastroesophageal reflux disease)    Hyperlipidemia    Hypertension    Overactive bladder    Vitamin D  deficiency     Past Surgical History:  Procedure Laterality Date   CORONARY ARTERY BYPASS GRAFT      Current Medications: Current Meds  Medication Sig   aspirin EC 81 MG tablet Take 162 mg by mouth daily.   Cholecalciferol (DIALYVITE VITAMIN D  5000) 125 MCG (5000 UT) capsule Take 5,000 Units by mouth daily.   Coenzyme Q-10 200 MG CAPS Take 300 mg by mouth daily.   glucosamine-chondroitin 500-400 MG tablet Take 1 tablet by mouth daily.   losartan-hydrochlorothiazide (HYZAAR) 50-12.5 MG tablet Take 1 tablet by mouth as needed (for low blood pressure).   Multiple Vitamin (MULTIVITAMIN) tablet Take 1 tablet by mouth daily.   omeprazole (PRILOSEC) 40 MG capsule Take 40 mg by mouth daily.   oxybutynin (DITROPAN-XL) 10 MG 24 hr tablet Take 10 mg by mouth daily.   rosuvastatin (CRESTOR) 20 MG tablet Take 20 mg by mouth daily.   sertraline (ZOLOFT) 50 MG tablet Take 50 mg by mouth as needed for anxiety.   [DISCONTINUED] losartan-hydrochlorothiazide (HYZAAR) 50-12.5 MG tablet Take 1 tablet by mouth  daily. (Patient taking differently: Take 1 tablet by mouth as needed (for high blood pressure).)   [DISCONTINUED] nitroGLYCERIN  (NITROSTAT ) 0.4 MG SL tablet Place 0.4 mg under the tongue every 5 (five) minutes as needed for chest pain.     Allergies:   Sulfa antibiotics   Social History   Socioeconomic History   Marital status: Widowed    Spouse name: Not on file   Number of children: Not on file   Years of education: Not on file   Highest education level: Not on file  Occupational History   Not on file  Tobacco Use   Smoking status: Never   Smokeless tobacco: Never  Vaping Use    Vaping status: Never Used  Substance and Sexual Activity   Alcohol use: No   Drug use: No   Sexual activity: Not on file  Other Topics Concern   Not on file  Social History Narrative   Not on file   Social Drivers of Health   Financial Resource Strain: Not on file  Food Insecurity: Not on file  Transportation Needs: Not on file  Physical Activity: Not on file  Stress: Not on file  Social Connections: Not on file     Family History: The patient's family history includes Arthritis in her mother; Diabetes in her brother; Heart disease in her brother and mother; Hypertension in her mother; Thyroid  disease in her mother.  ROS:   Please see the history of present illness.    All other systems reviewed and are negative.  EKGs/Labs/Other Studies Reviewed:    The following studies were reviewed today: .SABRAEKG Interpretation Date/Time:  Wednesday May 05 2024 13:28:19 EDT Ventricular Rate:  63 PR Interval:  182 QRS Duration:  80 QT Interval:  404 QTC Calculation: 413 R Axis:   59  Text Interpretation: Normal sinus rhythm Normal ECG When compared with ECG of 05-May-2023 15:06, Premature atrial complexes are no longer Present Confirmed by Edwyna Backers [id number]) on 05/05/2024 1:45:27 PM     Recent Labs: No results found for requested labs within last 365 days.  Recent Lipid Panel No results found for: CHOL, TRIG, HDL, CHOLHDL, VLDL, LDLCALC, LDLDIRECT  Physical Exam:    VS:  BP 124/62   Pulse 63   Ht 5' 8 (1.727 m)   Wt 178 lb 3.2 oz (80.8 kg)   SpO2 94%   BMI 27.10 kg/m     Wt Readings from Last 3 Encounters:  05/05/24 178 lb 3.2 oz (80.8 kg)  05/05/23 168 lb (76.2 kg)  03/20/22 183 lb 12.8 oz (83.4 kg)     GEN: Patient is in no acute distress HEENT: Normal NECK: No JVD; No carotid bruits LYMPHATICS: No lymphadenopathy CARDIAC: Hear sounds regular, 2/6 systolic murmur at the apex. RESPIRATORY:  Clear to auscultation without rales, wheezing or  rhonchi  ABDOMEN: Soft, non-tender, non-distended MUSCULOSKELETAL:  No edema; No deformity  SKIN: Warm and dry NEUROLOGIC:  Alert and oriented x 3 PSYCHIATRIC:  Normal affect   Signed, Backers JONELLE Edwyna, MD  05/05/2024 1:58 PM    [facility]

## 2024-05-05 NOTE — Patient Instructions (Addendum)
 Medication Instructions:  Your physician recommends that you continue on your current medications as directed. Please refer to the Current Medication list given to you today.  Use nitroglycerin  1 tablet placed under the tongue at the first sign of chest pain or an angina attack. 1 tablet may be used every 5 minutes as needed, for up to 15 minutes. Do not take more than 3 tablets in 15 minutes. If pain persist call 911 or go to the nearest ED.   *If you need a refill on your cardiac medications before your next appointment, please call your pharmacy*   Lab Work: None ordered If you have labs (blood work) drawn today and your tests are completely normal, you will receive your results only by: MyChart Message (if you have MyChart) OR A paper copy in the mail If you have any lab test that is abnormal or we need to change your treatment, we will call you to review the results.   Testing/Procedures: None ordered   Follow-Up: At [facility], you and your health needs are our priority.  As part of our continuing mission to provide you with exceptional heart care, we have created designated Provider Care Teams.  These Care Teams include your primary Cardiologist (physician) and Advanced Practice Providers (APPs -  Physician Assistants and Nurse Practitioners) who all work together to provide you with the care you need, when you need it.  We recommend signing up for the patient portal called MyChart.  Sign up information is provided on this After Visit Summary.  MyChart is used to connect with patients for Virtual Visits (Telemedicine).  Patients are able to view lab/test results, encounter notes, upcoming appointments, etc.  Non-urgent messages can be sent to your provider as well.   To learn more about what you can do with MyChart, go to [email or web address].    Your next appointment:   9 month(s)  The format for your next appointment:   In Person  Provider:   Jennifer Crape,  MD    Other Instructions none  Important Information About Sugar

## 2024-06-10 DIAGNOSIS — E559 Vitamin D deficiency, unspecified: Secondary | ICD-10-CM | POA: Diagnosis not present

## 2024-06-10 DIAGNOSIS — N3281 Overactive bladder: Secondary | ICD-10-CM | POA: Diagnosis not present

## 2024-06-10 DIAGNOSIS — I10 Essential (primary) hypertension: Secondary | ICD-10-CM | POA: Diagnosis not present

## 2024-06-10 DIAGNOSIS — J309 Allergic rhinitis, unspecified: Secondary | ICD-10-CM | POA: Diagnosis not present

## 2024-06-10 DIAGNOSIS — E782 Mixed hyperlipidemia: Secondary | ICD-10-CM | POA: Diagnosis not present

## 2024-06-10 DIAGNOSIS — I2510 Atherosclerotic heart disease of native coronary artery without angina pectoris: Secondary | ICD-10-CM | POA: Diagnosis not present

## 2024-06-10 DIAGNOSIS — K219 Gastro-esophageal reflux disease without esophagitis: Secondary | ICD-10-CM | POA: Diagnosis not present
